# Patient Record
Sex: Female | Born: 1990 | Race: Black or African American | Hispanic: No | Marital: Single | Smoking: Never smoker
Health system: Southern US, Community
[De-identification: ages and names within clinical notes are randomized; demographics above are authoritative.]

## PROBLEM LIST (undated history)

## (undated) DIAGNOSIS — IMO0002 Reserved for concepts with insufficient information to code with codable children: Secondary | ICD-10-CM

## (undated) DIAGNOSIS — O210 Mild hyperemesis gravidarum: Secondary | ICD-10-CM

## (undated) HISTORY — DX: Reserved for concepts with insufficient information to code with codable children: IMO0002

## (undated) HISTORY — DX: Mild hyperemesis gravidarum: O21.0

## (undated) HISTORY — PX: OTHER SURGICAL HISTORY: SHX169

---

## 2011-02-22 ENCOUNTER — Encounter (HOSPITAL_COMMUNITY): Payer: Self-pay

## 2011-02-22 ENCOUNTER — Inpatient Hospital Stay (HOSPITAL_COMMUNITY)
Admission: AD | Admit: 2011-02-22 | Discharge: 2011-02-22 | Disposition: A | Discharge status: Home / Self Care | Payer: Medicaid Other | Source: Ambulatory Visit | Attending: Obstetrics and Gynecology | Admitting: Obstetrics and Gynecology

## 2011-02-22 DIAGNOSIS — O210 Mild hyperemesis gravidarum: Secondary | ICD-10-CM | POA: Insufficient documentation

## 2011-02-22 LAB — URINALYSIS, ROUTINE W REFLEX MICROSCOPIC
Ketones, ur: >80 mg/dL — AB
Leukocytes, UA: NEGATIVE
Nitrite: NEGATIVE
Specific Gravity, Urine: >1.03 — ABNORMAL HIGH (ref 1.005–1.030)
pH: 6 (ref 5.0–8.0)

## 2011-02-22 LAB — URINE MICROSCOPIC-ADD ON

## 2011-02-22 LAB — POCT PREGNANCY, URINE: Preg Test, Ur: POSITIVE

## 2011-02-22 MED ORDER — ONDANSETRON HCL 4 MG/2ML IJ SOLN
4.0000 mg | Freq: Once | INTRAMUSCULAR | Status: AC
  Administered 2011-02-22: 4 mg via INTRAVENOUS
  Filled 2011-02-22: qty 2

## 2011-02-22 MED ORDER — M.V.I. ADULT IV INJ
10.0000 mL | Freq: Once | INTRAVENOUS | Status: AC
  Administered 2011-02-22: 10 mL via INTRAVENOUS
  Filled 2011-02-22: qty 10

## 2011-02-22 MED ORDER — PROMETHAZINE HCL 25 MG/ML IJ SOLN
25.0000 mg | Freq: Once | INTRAVENOUS | Status: AC
  Administered 2011-02-22: 25 mg via INTRAVENOUS
  Filled 2011-02-22: qty 1

## 2011-02-22 MED ORDER — PROMETHAZINE HCL 12.5 MG PO TABS: 12.5000 mg | ORAL_TABLET | Freq: Four times a day (QID) | ORAL | Status: DC | PRN

## 2011-02-22 MED ORDER — ACETAMINOPHEN 160 MG/5ML PO SOLN
650.0000 mg | Freq: Once | ORAL | Status: AC
  Administered 2011-02-22: 649.6 mg via ORAL
  Filled 2011-02-22: qty 20.3

## 2011-02-22 NOTE — Progress Notes (Signed)
Patient states she has not been able to eat for about 3 days.

## 2011-02-22 NOTE — Plan of Care (Signed)
The patient was reports improvement of symptoms after receiving IV fluids and vitamins  A:  Hyperemesis of pregnancy  P:   Discharge to home  Prescription for Phenergan  Followup with primary care provider [facility]

## 2011-02-22 NOTE — ED Provider Notes (Addendum)
History   Chief Complaint:  Emesis During Pregnancy   Karen Rivera is  20 y.o. G1P0 Patient's last menstrual period was 01/12/2011.Marland Kitchen  Her pregnancy status is positive.  She is Unknown by LMP.  She presents complaining of Emesis During Pregnancy vomiting has been present for  2days.   OB History    Grav Para Term Preterm Abortions TAB SAB Ect Mult Living   1                Past Medical History  Diagnosis Date  . Asthma     No past surgical history on file.  Family History  Problem Relation Age of Onset  . Hypertension Mother     History  Substance Use Topics  . Smoking status: Not on file  . Smokeless tobacco: Not on file  . Alcohol Use: Not on file    Allergies: No Known Allergies  No prescriptions prior to admission    Review of Systems - History obtained from the patient  Gastrointestinal ROS: no abdominal pain, change in bowel habits, or black or bloody stools positive for - nausea/vomiting  Physical Exam   Blood pressure 133/83, pulse 105, temperature 99 F (37.2 C), temperature source Oral, resp. rate 16, height 5\' 7"  (1.702 m), weight 139 lb 3.2 oz (63.141 kg), last menstrual period 01/12/2011.  General: General appearance - oriented to person, place, and time and ill-appearing Mental status - alert, oriented to person, place, and time, normal mood, behavior, speech, dress, motor activity, and thought processes Musculoskeletal - full range of motion without pain Skin - normal coloration and turgor, no rashes, no suspicious skin lesions noted Focused Gynecological Exam: examination not indicated  Labs: Recent Results (from the past 24 hour(s))  URINALYSIS, ROUTINE W REFLEX MICROSCOPIC   Collection Time   02/22/11  4:50 PM      Component Value Range   Color, Urine YELLOW  YELLOW    Appearance HAZY (*) CLEAR    Specific Gravity, Urine >1.030 (*) 1.005 - 1.030    pH 6.0  5.0 - 8.0    Glucose, UA NEGATIVE  NEGATIVE (mg/dL)   Hgb urine dipstick  NEGATIVE  NEGATIVE    Bilirubin Urine NEGATIVE  NEGATIVE    Ketones, ur >80 (*) NEGATIVE (mg/dL)   Protein, ur 161 (*) NEGATIVE (mg/dL)   Urobilinogen, UA 0.2  0.0 - 1.0 (mg/dL)   Nitrite NEGATIVE  NEGATIVE    Leukocytes, UA NEGATIVE  NEGATIVE   URINE MICROSCOPIC-ADD ON   Collection Time   02/22/11  4:50 PM      Component Value Range   Squamous Epithelial / LPF FEW (*) RARE    Bacteria, UA FEW (*) RARE    Urine-Other MUCOUS PRESENT    POCT PREGNANCY, URINE   Collection Time   02/22/11  5:02 PM      Component Value Range   Preg Test, Ur POSITIVE      Assessment: Hyperemesis in pregnancy     Plan: Phenergan 25 mg in 1000 cc LR - 500 cc bolus and then 500 cc/hr.  Will give Zofran 4 mg IV push if needed for nausea. Advised to continue prenatal care  - [facility] Will give multivitamins in 1000cc D5LR with 500 cc bolus  And 500 cc per hour.  Discharge Medications:     Camiah Humm 02/22/2011, 5:53 PM     Nolene Bernheim, NP 03/08/11 [phone]

## 2011-02-22 NOTE — Progress Notes (Signed)
IV d/ced; pt getting ready to be discharged home;

## 2011-02-22 NOTE — Progress Notes (Signed)
Vomiting since yesterday had morning sickness

## 2011-02-28 ENCOUNTER — Encounter (HOSPITAL_COMMUNITY): Payer: Self-pay

## 2011-02-28 ENCOUNTER — Inpatient Hospital Stay (HOSPITAL_COMMUNITY)
Admission: AD | Admit: 2011-02-28 | Discharge: 2011-03-01 | Disposition: A | Discharge status: Home / Self Care | Payer: Medicaid Other | Source: Ambulatory Visit | Attending: Obstetrics & Gynecology | Admitting: Obstetrics & Gynecology

## 2011-02-28 DIAGNOSIS — O219 Vomiting of pregnancy, unspecified: Secondary | ICD-10-CM | POA: Insufficient documentation

## 2011-02-28 LAB — HIV ANTIBODY (ROUTINE TESTING W REFLEX): HIV: NONREACTIVE

## 2011-02-28 LAB — HEPATITIS B SURFACE ANTIGEN: Hepatitis B Surface Ag: NEGATIVE

## 2011-02-28 LAB — URINALYSIS, ROUTINE W REFLEX MICROSCOPIC
Bilirubin Urine: NEGATIVE
Hgb urine dipstick: NEGATIVE
Ketones, ur: >80 mg/dL — AB
Specific Gravity, Urine: >1.03 — ABNORMAL HIGH (ref 1.005–1.030)
Urobilinogen, UA: 0.2 mg/dL (ref 0.0–1.0)

## 2011-02-28 LAB — ABO/RH: RH Type: POSITIVE

## 2011-02-28 LAB — GC/CHLAMYDIA PROBE AMP, GENITAL: Chlamydia: NEGATIVE

## 2011-02-28 LAB — ANTIBODY SCREEN: Antibody Screen: NEGATIVE

## 2011-02-28 MED ORDER — M.V.I. ADULT IV INJ
10.0000 mL | Freq: Once | INTRAVENOUS | Status: AC
  Administered 2011-02-28: 10 mL via INTRAVENOUS
  Filled 2011-02-28: qty 10

## 2011-02-28 MED ORDER — PROMETHAZINE HCL 25 MG/ML IJ SOLN
25.0000 mg | Freq: Once | INTRAVENOUS | Status: AC
  Administered 2011-02-28: 25 mg via INTRAVENOUS
  Filled 2011-02-28: qty 1

## 2011-02-28 MED ORDER — SODIUM CHLORIDE 0.9 % IV SOLN
8.0000 mg | Freq: Once | INTRAVENOUS | Status: AC
  Administered 2011-02-28: 8 mg via INTRAVENOUS
  Filled 2011-02-28: qty 4

## 2011-02-28 NOTE — Progress Notes (Signed)
Pt states, I started vomiting for one and a half weeks ago and  I was here last Wed and had IVF. I was fine after that until yesterday and then I would vomit every time I ate anything. The pills are now working now. Even water touching my mouth. causes me to vomit

## 2011-02-28 NOTE — ED Provider Notes (Signed)
History     Chief Complaint  Patient presents with  . Dehydration   HPI 20 yo G1 presents with hyperemesis.  This is her second visit here for same sxs.  Patient of [facility].  Now [redacted]w[redacted]d pregnant.  Has phenergan at home but not working well now.  Denies vaginal bleeding or discharge.  Vomited X 7 today.  Can't keep food or fluid down.      Past Medical History  Diagnosis Date  . Asthma     Past Surgical History  Procedure Date  . Extraction of wisdom teeth     Family History  Problem Relation Age of Onset  . Hypertension Mother     History  Substance Use Topics  . Smoking status: Never Smoker   . Smokeless tobacco: Never Used  . Alcohol Use: No    Allergies: No Known Allergies  Prescriptions prior to admission  Medication Sig Dispense Refill  . ALBUTEROL IN Inhale 2 puffs into the lungs every 4 (four) hours as needed. Use as needed for athsma       . folic acid (FOLVITE) 400 MCG tablet Take 400 mcg by mouth daily.        . Menthol, Topical Analgesic, (ICY HOT EX) Apply 1 application topically daily as needed. As needed for back pain       . Multiple Vitamin (MULTIVITAMIN) tablet Take 1 tablet by mouth daily.        . promethazine (PHENERGAN) 12.5 MG tablet Take 1 tablet (12.5 mg total) by mouth every 6 (six) hours as needed for nausea.  30 tablet  0    Review of Systems  Gastrointestinal: Positive for nausea and vomiting. Negative for abdominal pain.  Genitourinary: Negative.  Flank pain: neg for discharge or vaginal bleeding.   Physical Exam   Blood pressure 133/83, pulse 86, temperature 99.2 F (37.3 C), temperature source Oral, resp. rate 16, height 5\' 6"  (1.676 m), weight 137 lb 9.6 oz (62.415 kg), last menstrual period 01/12/2011, SpO2 100.00%.  Physical Exam  GI: She exhibits no distension and no mass. There is no tenderness. There is no rebound and no guarding.    MAU Course  Procedures Results for orders placed during the hospital encounter of  02/28/11 (from the past 24 hour(s))  URINALYSIS, ROUTINE W REFLEX MICROSCOPIC     Status: Abnormal   Collection Time   02/28/11  6:00 PM      Component Value Range   Color, Urine YELLOW  YELLOW    Appearance CLEAR  CLEAR    Specific Gravity, Urine >1.030 (*) 1.005 - 1.030    pH 6.0  5.0 - 8.0    Glucose, UA NEGATIVE  NEGATIVE (mg/dL)   Hgb urine dipstick NEGATIVE  NEGATIVE    Bilirubin Urine NEGATIVE  NEGATIVE    Ketones, ur >80 (*) NEGATIVE (mg/dL)   Protein, ur NEGATIVE  NEGATIVE (mg/dL)   Urobilinogen, UA 0.2  0.0 - 1.0 (mg/dL)   Nitrite NEGATIVE  NEGATIVE    Leukocytes, UA NEGATIVE  NEGATIVE   POCT PREGNANCY, URINE     Status: Normal   Collection Time   02/28/11  6:05 PM      Component Value Range   Preg Test, Ur POSITIVE     MDM IV Hydration  Assessment and Plan  Hyperemesis IV Hydration  KEY,EVE M 02/28/2011, 7:20 PM   Matt Holmes, NP 03/11/11 1610

## 2011-02-28 NOTE — Progress Notes (Signed)
Pt states she has had some nausea and vomiting that had stopped. Started again 7-23 and when she has vomiting or gets cold has mid to upper abdominal pain. Has had a 14 lb weight loss since 6-15.

## 2011-03-01 MED ORDER — ONDANSETRON HCL 8 MG PO TABS: 8.0000 mg | ORAL_TABLET | Freq: Three times a day (TID) | ORAL | Status: AC | PRN

## 2011-03-01 MED ORDER — ONDANSETRON 8 MG PO TBDP: 8.0000 mg | ORAL_TABLET | Freq: Three times a day (TID) | ORAL | Status: AC | PRN

## 2011-03-01 NOTE — ED Provider Notes (Signed)
No vomiting. Nausea resolved w/ Zofran.   D/C home when second bag IV fluids complete.  Rx Zofran  Initiate [facility]  [place], [place]

## 2011-03-08 ENCOUNTER — Other Ambulatory Visit: Payer: Self-pay | Admitting: Family Medicine

## 2011-03-08 DIAGNOSIS — Z3682 Encounter for antenatal screening for nuchal translucency: Secondary | ICD-10-CM

## 2011-03-08 NOTE — ED Provider Notes (Signed)
Agree with above note.  Larayne Baxley 03/08/2011 2:17 PM   

## 2011-04-07 ENCOUNTER — Other Ambulatory Visit: Payer: Self-pay | Admitting: Obstetrics and Gynecology

## 2011-04-07 ENCOUNTER — Ambulatory Visit (HOSPITAL_COMMUNITY)
Admission: RE | Admit: 2011-04-07 | Discharge: 2011-04-07 | Disposition: A | Discharge status: Home / Self Care | Payer: Medicaid Other | Source: Ambulatory Visit | Attending: Family Medicine | Admitting: Family Medicine

## 2011-04-07 VITALS — BP 115/72 | HR 90 | Wt 142.0 lb

## 2011-04-07 DIAGNOSIS — Z3682 Encounter for antenatal screening for nuchal translucency: Secondary | ICD-10-CM

## 2011-04-07 DIAGNOSIS — O3510X0 Maternal care for (suspected) chromosomal abnormality in fetus, unspecified, not applicable or unspecified: Secondary | ICD-10-CM | POA: Insufficient documentation

## 2011-04-07 DIAGNOSIS — O351XX0 Maternal care for (suspected) chromosomal abnormality in fetus, not applicable or unspecified: Secondary | ICD-10-CM | POA: Insufficient documentation

## 2011-04-07 DIAGNOSIS — Z3689 Encounter for other specified antenatal screening: Secondary | ICD-10-CM | POA: Insufficient documentation

## 2011-04-07 NOTE — Progress Notes (Signed)
Ultrasound in AS/OBGYN/EPIC.  Follow up U/S scheduled 

## 2011-04-28 ENCOUNTER — Ambulatory Visit (HOSPITAL_COMMUNITY)
Admission: RE | Admit: 2011-04-28 | Discharge: 2011-04-28 | Disposition: A | Discharge status: Home / Self Care | Payer: Medicaid Other | Source: Ambulatory Visit | Attending: Family Medicine | Admitting: Family Medicine

## 2011-04-28 DIAGNOSIS — O351XX0 Maternal care for (suspected) chromosomal abnormality in fetus, not applicable or unspecified: Secondary | ICD-10-CM | POA: Insufficient documentation

## 2011-04-28 DIAGNOSIS — O3510X0 Maternal care for (suspected) chromosomal abnormality in fetus, unspecified, not applicable or unspecified: Secondary | ICD-10-CM | POA: Insufficient documentation

## 2011-05-12 ENCOUNTER — Other Ambulatory Visit (HOSPITAL_COMMUNITY): Payer: Self-pay | Admitting: Obstetrics and Gynecology

## 2011-05-12 ENCOUNTER — Ambulatory Visit (HOSPITAL_COMMUNITY)
Admission: RE | Admit: 2011-05-12 | Discharge: 2011-05-12 | Disposition: A | Discharge status: Home / Self Care | Payer: Medicaid Other | Source: Ambulatory Visit | Attending: Family Medicine | Admitting: Family Medicine

## 2011-05-12 DIAGNOSIS — Z3682 Encounter for antenatal screening for nuchal translucency: Secondary | ICD-10-CM

## 2011-05-12 DIAGNOSIS — Z3689 Encounter for other specified antenatal screening: Secondary | ICD-10-CM

## 2011-05-12 DIAGNOSIS — Z1389 Encounter for screening for other disorder: Secondary | ICD-10-CM | POA: Insufficient documentation

## 2011-05-12 DIAGNOSIS — O358XX0 Maternal care for other (suspected) fetal abnormality and damage, not applicable or unspecified: Secondary | ICD-10-CM | POA: Insufficient documentation

## 2011-05-12 DIAGNOSIS — O35EXX0 Maternal care for other (suspected) fetal abnormality and damage, fetal genitourinary anomalies, not applicable or unspecified: Secondary | ICD-10-CM

## 2011-05-12 DIAGNOSIS — Z363 Encounter for antenatal screening for malformations: Secondary | ICD-10-CM | POA: Insufficient documentation

## 2011-05-12 NOTE — Progress Notes (Signed)
Encounter addended by: Macarthur Critchley. Rachel Bo on: 05/12/2011 10:42 AM<BR>     Documentation filed: Orders

## 2011-06-20 ENCOUNTER — Ambulatory Visit (HOSPITAL_COMMUNITY)
Admission: RE | Admit: 2011-06-20 | Discharge: 2011-06-20 | Disposition: A | Discharge status: Home / Self Care | Payer: Medicaid Other | Source: Ambulatory Visit | Attending: Maternal and Fetal Medicine | Admitting: Maternal and Fetal Medicine

## 2011-06-20 DIAGNOSIS — Z3689 Encounter for other specified antenatal screening: Secondary | ICD-10-CM | POA: Insufficient documentation

## 2011-06-20 DIAGNOSIS — O358XX0 Maternal care for other (suspected) fetal abnormality and damage, not applicable or unspecified: Secondary | ICD-10-CM

## 2011-06-20 NOTE — Progress Notes (Signed)
Obstetric ultrasound performed today.  Please see report in ASOBGYN. 

## 2011-06-22 NOTE — Progress Notes (Signed)
Encounter addended by: Marlana Latus, RN on: 06/22/2011  5:00 PM<BR>     Documentation filed: Episodes, Chief Complaint Section

## 2011-08-08 NOTE — L&D Delivery Note (Addendum)
Delivery Note At 8:48 PM a viable female was delivered via Vaginal, Spontaneous Delivery (Presentation: Left Occiput Anterior, left hand by face).  APGAR: 9, 9; weight 5 lb 9.1 oz (2526 g).   Placenta status: Intact, Spontaneous.  Cord: 3 vessels with the following complications: None.  Cord pH: Not indicated  Anesthesia: None  Episiotomy: None Lacerations: None Suture Repair: N/A Est. Blood Loss (mL): 600  Mom to postpartum.  Baby to nursery-stable.  HAIRFORD, AMBER 09/29/2011, 9:06 PM   I attended and precepted the delivery with Dr Mikel Cella.  I agree with the above note.  Candelaria Celeste JEHIEL 09/29/2011 9:11 PM

## 2011-08-10 ENCOUNTER — Ambulatory Visit (HOSPITAL_COMMUNITY)
Admission: RE | Admit: 2011-08-10 | Discharge: 2011-08-10 | Disposition: A | Discharge status: Home / Self Care | Payer: Medicaid Other | Source: Ambulatory Visit | Attending: Family Medicine | Admitting: Family Medicine

## 2011-08-10 DIAGNOSIS — O359XX0 Maternal care for (suspected) fetal abnormality and damage, unspecified, not applicable or unspecified: Secondary | ICD-10-CM

## 2011-08-10 DIAGNOSIS — O358XX0 Maternal care for other (suspected) fetal abnormality and damage, not applicable or unspecified: Secondary | ICD-10-CM

## 2011-08-10 DIAGNOSIS — Z3689 Encounter for other specified antenatal screening: Secondary | ICD-10-CM | POA: Insufficient documentation

## 2011-08-10 NOTE — Progress Notes (Signed)
Obstetric ultrasound performed today.  Please see report in ASOBGYN. 

## 2011-09-01 ENCOUNTER — Ambulatory Visit (HOSPITAL_COMMUNITY)
Admission: RE | Admit: 2011-09-01 | Discharge: 2011-09-01 | Disposition: A | Discharge status: Home / Self Care | Payer: Medicaid Other | Source: Ambulatory Visit | Attending: Family Medicine | Admitting: Family Medicine

## 2011-09-01 DIAGNOSIS — O365990 Maternal care for other known or suspected poor fetal growth, unspecified trimester, not applicable or unspecified: Secondary | ICD-10-CM | POA: Insufficient documentation

## 2011-09-01 DIAGNOSIS — Z3689 Encounter for other specified antenatal screening: Secondary | ICD-10-CM | POA: Insufficient documentation

## 2011-09-01 DIAGNOSIS — O359XX0 Maternal care for (suspected) fetal abnormality and damage, unspecified, not applicable or unspecified: Secondary | ICD-10-CM

## 2011-09-14 ENCOUNTER — Ambulatory Visit (HOSPITAL_COMMUNITY): Payer: Medicaid Other

## 2011-09-21 ENCOUNTER — Ambulatory Visit (HOSPITAL_COMMUNITY)
Admission: RE | Admit: 2011-09-21 | Discharge: 2011-09-21 | Disposition: A | Discharge status: Home / Self Care | Payer: Medicaid Other | Source: Ambulatory Visit | Attending: Family Medicine | Admitting: Family Medicine

## 2011-09-21 ENCOUNTER — Other Ambulatory Visit (HOSPITAL_COMMUNITY): Payer: Self-pay | Admitting: Maternal and Fetal Medicine

## 2011-09-21 DIAGNOSIS — O359XX0 Maternal care for (suspected) fetal abnormality and damage, unspecified, not applicable or unspecified: Secondary | ICD-10-CM

## 2011-09-21 DIAGNOSIS — O365990 Maternal care for other known or suspected poor fetal growth, unspecified trimester, not applicable or unspecified: Secondary | ICD-10-CM | POA: Insufficient documentation

## 2011-09-28 ENCOUNTER — Ambulatory Visit (HOSPITAL_COMMUNITY)
Admission: RE | Admit: 2011-09-28 | Discharge: 2011-09-28 | Disposition: A | Discharge status: Home / Self Care | Payer: Medicaid Other | Source: Ambulatory Visit | Attending: Family Medicine | Admitting: Family Medicine

## 2011-09-28 ENCOUNTER — Inpatient Hospital Stay (HOSPITAL_COMMUNITY)
Admission: RE | Admit: 2011-09-28 | Discharge: 2011-10-01 | DRG: 775 | Disposition: A | Discharge status: Home / Self Care | Payer: Medicaid Other | Source: Ambulatory Visit | Attending: Obstetrics & Gynecology | Admitting: Obstetrics & Gynecology

## 2011-09-28 ENCOUNTER — Encounter (HOSPITAL_COMMUNITY): Payer: Self-pay

## 2011-09-28 ENCOUNTER — Other Ambulatory Visit (HOSPITAL_COMMUNITY): Payer: Self-pay | Admitting: Maternal and Fetal Medicine

## 2011-09-28 ENCOUNTER — Encounter (HOSPITAL_COMMUNITY): Payer: Self-pay | Admitting: *Deleted

## 2011-09-28 ENCOUNTER — Telehealth (HOSPITAL_COMMUNITY): Payer: Self-pay | Admitting: *Deleted

## 2011-09-28 DIAGNOSIS — O359XX0 Maternal care for (suspected) fetal abnormality and damage, unspecified, not applicable or unspecified: Secondary | ICD-10-CM

## 2011-09-28 DIAGNOSIS — O4100X0 Oligohydramnios, unspecified trimester, not applicable or unspecified: Principal | ICD-10-CM | POA: Diagnosis present

## 2011-09-28 DIAGNOSIS — O365990 Maternal care for other known or suspected poor fetal growth, unspecified trimester, not applicable or unspecified: Secondary | ICD-10-CM | POA: Diagnosis present

## 2011-09-28 MED ORDER — BUTORPHANOL TARTRATE 2 MG/ML IJ SOLN
1.0000 mg | INTRAMUSCULAR | Status: DC | PRN
  Administered 2011-09-29 (×2): 1 mg via INTRAVENOUS
  Filled 2011-09-28 (×2): qty 1

## 2011-09-28 MED ORDER — ACETAMINOPHEN 325 MG PO TABS: 650.0000 mg | ORAL_TABLET | ORAL | Status: DC | PRN

## 2011-09-28 MED ORDER — ONDANSETRON HCL 4 MG/2ML IJ SOLN: 4.0000 mg | Freq: Four times a day (QID) | INTRAMUSCULAR | Status: DC | PRN

## 2011-09-28 MED ORDER — PRENATAL PLUS 27-1 MG PO TABS: 1.0000 | ORAL_TABLET | Freq: Every day | ORAL | Status: DC

## 2011-09-28 MED ORDER — OXYTOCIN 20 UNITS IN LACTATED RINGERS INFUSION - SIMPLE
125.0000 mL/h | Freq: Once | INTRAVENOUS | Status: AC
  Administered 2011-09-29: 125 mL/h via INTRAVENOUS

## 2011-09-28 MED ORDER — OXYCODONE-ACETAMINOPHEN 5-325 MG PO TABS: 1.0000 | ORAL_TABLET | ORAL | Status: DC | PRN

## 2011-09-28 MED ORDER — IBUPROFEN 600 MG PO TABS
600.0000 mg | ORAL_TABLET | Freq: Four times a day (QID) | ORAL | Status: DC | PRN
  Administered 2011-09-29: 600 mg via ORAL
  Filled 2011-09-28: qty 1

## 2011-09-28 MED ORDER — CITRIC ACID-SODIUM CITRATE 334-500 MG/5ML PO SOLN: 30.0000 mL | ORAL | Status: DC | PRN

## 2011-09-28 MED ORDER — LACTATED RINGERS IV SOLN
INTRAVENOUS | Status: DC
  Administered 2011-09-29 (×3): via INTRAVENOUS

## 2011-09-28 MED ORDER — LIDOCAINE HCL (PF) 1 % IJ SOLN
30.0000 mL | INTRAMUSCULAR | Status: DC | PRN
  Filled 2011-09-28: qty 30

## 2011-09-28 MED ORDER — MISOPROSTOL 25 MCG QUARTER TABLET
25.0000 ug | ORAL_TABLET | ORAL | Status: DC | PRN
  Administered 2011-09-28 – 2011-09-29 (×3): 25 ug via VAGINAL
  Filled 2011-09-28 (×3): qty 0.25

## 2011-09-28 MED ORDER — FLEET ENEMA 7-19 GM/118ML RE ENEM: 1.0000 | ENEMA | RECTAL | Status: DC | PRN

## 2011-09-28 MED ORDER — LACTATED RINGERS IV SOLN: 500.0000 mL | INTRAVENOUS | Status: DC | PRN

## 2011-09-28 MED ORDER — OXYTOCIN BOLUS FROM INFUSION
500.0000 mL | Freq: Once | INTRAVENOUS | Status: DC
  Filled 2011-09-28: qty 500

## 2011-09-28 MED ORDER — ZOLPIDEM TARTRATE 10 MG PO TABS
10.0000 mg | ORAL_TABLET | Freq: Every evening | ORAL | Status: DC | PRN
  Administered 2011-09-29: 10 mg via ORAL
  Filled 2011-09-28: qty 1

## 2011-09-28 MED ORDER — TERBUTALINE SULFATE 1 MG/ML IJ SOLN: 0.2500 mg | Freq: Once | INTRAMUSCULAR | Status: AC | PRN

## 2011-09-28 NOTE — H&P (Signed)
Attestation of Attending Supervision of Resident: Evaluation and management procedures were performed by the [facility] Resident under my supervision.  I have reviewed the resident's note and chart, and I agree with management and plan.  Jaynie Collins, M.D. 09/28/2011 11:14 PM
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
## 2011-09-28 NOTE — Progress Notes (Signed)
OB ultrasound performed today.  Please see full report in ASOBGYN.  IUP at 37 weeks 0 days Oligohydramnios Reassuring BPP Normal umbilical artery Doppler measurements  Given gestational age with a finding of oligohydramnios and poor fetal growth, delivery is recommended.  Patient requested to be scheduled later this evening for admission.  Patient discussed with Dr. Debroah Loop.

## 2011-09-28 NOTE — Telephone Encounter (Signed)
Preadmission screen  

## 2011-09-29 ENCOUNTER — Encounter (HOSPITAL_COMMUNITY): Payer: Self-pay

## 2011-09-29 DIAGNOSIS — O4100X0 Oligohydramnios, unspecified trimester, not applicable or unspecified: Secondary | ICD-10-CM

## 2011-09-29 DIAGNOSIS — O365990 Maternal care for other known or suspected poor fetal growth, unspecified trimester, not applicable or unspecified: Secondary | ICD-10-CM

## 2011-09-29 LAB — RPR: RPR Ser Ql: NONREACTIVE

## 2011-09-29 MED ORDER — TERBUTALINE SULFATE 1 MG/ML IJ SOLN: 0.2500 mg | Freq: Once | INTRAMUSCULAR | Status: DC | PRN

## 2011-09-29 MED ORDER — OXYCODONE-ACETAMINOPHEN 5-325 MG PO TABS
1.0000 | ORAL_TABLET | ORAL | Status: DC | PRN
  Administered 2011-09-30 (×3): 1 via ORAL
  Filled 2011-09-29 (×3): qty 1

## 2011-09-29 MED ORDER — IBUPROFEN 600 MG PO TABS
600.0000 mg | ORAL_TABLET | Freq: Four times a day (QID) | ORAL | Status: DC
  Administered 2011-09-30 – 2011-10-01 (×6): 600 mg via ORAL
  Filled 2011-09-29 (×7): qty 1

## 2011-09-29 MED ORDER — TETANUS-DIPHTH-ACELL PERTUSSIS 5-2.5-18.5 LF-MCG/0.5 IM SUSP: 0.5000 mL | Freq: Once | INTRAMUSCULAR | Status: DC

## 2011-09-29 MED ORDER — DIPHENHYDRAMINE HCL 25 MG PO CAPS: 25.0000 mg | ORAL_CAPSULE | Freq: Four times a day (QID) | ORAL | Status: DC | PRN

## 2011-09-29 MED ORDER — PRENATAL MULTIVITAMIN CH
1.0000 | ORAL_TABLET | Freq: Every day | ORAL | Status: DC
  Administered 2011-09-30 – 2011-10-01 (×2): 1 via ORAL
  Filled 2011-09-29 (×2): qty 1

## 2011-09-29 MED ORDER — LANOLIN HYDROUS EX OINT: TOPICAL_OINTMENT | CUTANEOUS | Status: DC | PRN

## 2011-09-29 MED ORDER — BENZOCAINE-MENTHOL 20-0.5 % EX AERO
INHALATION_SPRAY | CUTANEOUS | Status: AC
  Administered 2011-09-30: 1 via TOPICAL
  Filled 2011-09-29: qty 56

## 2011-09-29 MED ORDER — WITCH HAZEL-GLYCERIN EX PADS: 1.0000 "application " | MEDICATED_PAD | CUTANEOUS | Status: DC | PRN

## 2011-09-29 MED ORDER — DIBUCAINE 1 % RE OINT: 1.0000 "application " | TOPICAL_OINTMENT | RECTAL | Status: DC | PRN

## 2011-09-29 MED ORDER — ONDANSETRON HCL 4 MG/2ML IJ SOLN: 4.0000 mg | INTRAMUSCULAR | Status: DC | PRN

## 2011-09-29 MED ORDER — ONDANSETRON HCL 4 MG PO TABS: 4.0000 mg | ORAL_TABLET | ORAL | Status: DC | PRN

## 2011-09-29 MED ORDER — SIMETHICONE 80 MG PO CHEW: 80.0000 mg | CHEWABLE_TABLET | ORAL | Status: DC | PRN

## 2011-09-29 MED ORDER — OXYTOCIN 20 UNITS IN LACTATED RINGERS INFUSION - SIMPLE
1.0000 m[IU]/min | INTRAVENOUS | Status: DC
Start: 2011-09-29 — End: 2011-09-29
  Administered 2011-09-29: 2 m[IU]/min via INTRAVENOUS
  Filled 2011-09-29: qty 1000

## 2011-09-29 MED ORDER — SENNOSIDES-DOCUSATE SODIUM 8.6-50 MG PO TABS
2.0000 | ORAL_TABLET | Freq: Every day | ORAL | Status: DC
  Administered 2011-09-30: 2 via ORAL

## 2011-09-29 MED ORDER — ZOLPIDEM TARTRATE 5 MG PO TABS: 5.0000 mg | ORAL_TABLET | Freq: Every evening | ORAL | Status: DC | PRN

## 2011-09-29 MED ORDER — BENZOCAINE-MENTHOL 20-0.5 % EX AERO
1.0000 "application " | INHALATION_SPRAY | CUTANEOUS | Status: DC | PRN
  Administered 2011-09-30: 1 via TOPICAL

## 2011-09-29 NOTE — Progress Notes (Signed)
Subjective: Very uncomfortable with contractions.  Objective: BP 119/77  Pulse 92  Temp(Src) 98.5 F (36.9 C) (Oral)  Resp 18  Ht 5\' 7"  (1.702 m)  Wt 76.658 kg (169 lb)  BMI 26.47 kg/m2  LMP 01/12/2011      FHT:  FHR: 130s bpm, variability: moderate,  accelerations:  Present,  decelerations:  Absent UC:   regular, every 2 minutes SVE:   Dilation: 6 Effacement (%): 100 Station: -1 Exam by:: lee  Labs: No results found for this basename: WBC, HGB, HCT, MCV, PLT    Assessment / Plan: Continue pitocin.  Category 1 tracing.  Expect NSVD  Karen Rivera JEHIEL 09/29/2011, 6:48 PM

## 2011-09-29 NOTE — Progress Notes (Signed)
Subjective: Patient feeling cramping.  Objective: BP 113/73  Pulse 84  Temp(Src) 98.4 F (36.9 C) (Oral)  Resp 16  Ht 5\' 7"  (1.702 m)  Wt 76.658 kg (169 lb)  BMI 26.47 kg/m2  LMP 01/12/2011      FHT:  FHR: 130s bpm, variability: moderate,  accelerations:  Present,  decelerations:  Absent UC:   regular, every 2-4 minutes SVE:   Dilation: 1 Effacement (%): 50 Station: -2 Exam by:: Tressia Danas RN  Labs: No results found for this basename: WBC, HGB, HCT, MCV, PLT    Assessment / Plan: Foley balloon placed.  Will start pitocin.  Karen Rivera 09/29/2011, 1:04 PM

## 2011-09-29 NOTE — Progress Notes (Signed)
Karen Rivera is a 21 y.o. G1P0000 at [redacted]w[redacted]d  Subjective: Patient doing well. Cytotec placed at 2345 with no difficulty. Patient had dinner, will now be on clears diet. No concerns at this time  Objective: BP 117/75  Pulse 91  Temp(Src) 98.4 F (36.9 C) (Oral)  Resp 18  Ht 5\' 7"  (1.702 m)  Wt 76.658 kg (169 lb)  BMI 26.47 kg/m2  LMP 01/12/2011     FHT:  FHR: 140's bpm, variability: moderate,  accelerations:  Absent,  decelerations:  Absent UC:   none SVE:   Dilation: 1 Station: -2 Exam by:: T. Lessard RN  Labs: No results found for this basename: WBC, HGB, HCT, MCV, PLT    Assessment / Plan: Induction of labor due to oligo  Labor: Progressing normally Fetal Wellbeing:  Category I Pain Control:  N/A Anticipated MOD:  NSVD  Discussed with Sharen Counter CNM  HAIRFORD, AMBER 09/29/2011, 1:04 AM

## 2011-09-29 NOTE — Progress Notes (Signed)
Patient ID: Karen Rivera, female   DOB: Dec 08, 1990, 21 y.o.   MRN: [id number]  Foley balloon out.    Dilation: 5 Effacement (%): 80 Cervical Position: Middle Station: -2 Presentation: Vertex Exam by:: Brenda Samano  Continue with pitocin.  Ctx q 2 minutes.  Adequate progress.  Candelaria Celeste JEHIEL 09/29/2011 3:38 PM

## 2011-09-29 NOTE — Progress Notes (Signed)
Karen Rivera is a 21 y.o. G1P0000 at [redacted]w[redacted]d  Subjective: Resting comfortably. Received second dose of Cytotec at 0350. Did receive Ambien to help her rest as well. No concerns at this time.  Objective: BP 121/81  Pulse 101  Temp(Src) 98.5 F (36.9 C) (Oral)  Resp 18  Ht 5\' 7"  (1.702 m)  Wt 76.658 kg (169 lb)  BMI 26.47 kg/m2  LMP 01/12/2011    FHT:  FHR: 140's bpm, variability: minimal ,  accelerations:  Absent,  decelerations:  Absent UC:   none SVE:   Dilation: 1 Effacement (%): 40 Station: -2 Exam by:: T.Lessard RN  Labs: No results found for this basename: WBC, HGB, HCT, MCV, PLT    Assessment / Plan: Induction of labor due to Oligo. Received Cytotec x2  Labor: Progressing normally Fetal Wellbeing:  Category I Pain Control:  N/A Anticipated MOD:  NSVD  Ryane Konieczny 09/29/2011, 4:12 AM

## 2011-09-30 NOTE — Progress Notes (Signed)
Post Partum Day #1 Subjective: no complaints, up ad lib, voiding, tolerating PO and + flatus Patient states she has been OOB to bathroom. She feels "lazy" but does not have any complaints. States she is still bleeding but it has slowed down.   Objective: Blood pressure 96/58, pulse 87, temperature 98.3 F (36.8 C), temperature source Oral, resp. rate 18, height 5\' 7"  (1.702 m), weight 76.658 kg (169 lb), last menstrual period 01/12/2011, SpO2 99.00%, unknown if currently breastfeeding.  Physical Exam:  General: cooperative, no distress and sleeping but awakes easily and answers questions Lochia: appropriate Uterine Fundus: firm Incision: N/A DVT Evaluation: No evidence of DVT seen on physical exam.  No results found for this basename: HGB:2,HCT:2 in the last 72 hours  Assessment/Plan: Plan for discharge tomorrow, Breastfeeding and Lactation consult Mom plans to use Minipill for contraception. [facility]   LOS: 2 days   Karen Rivera 09/30/2011, 6:59 AM

## 2011-10-01 MED ORDER — ACETAMINOPHEN-CODEINE 300-30 MG PO TABS
1.0000 | ORAL_TABLET | ORAL | Status: DC | PRN
Start: 2011-10-01 — End: 2011-11-27

## 2011-10-01 MED ORDER — NORETHINDRONE 0.35 MG PO TABS: 1.0000 | ORAL_TABLET | Freq: Every day | ORAL | Status: DC

## 2011-10-01 MED ORDER — IBUPROFEN 600 MG PO TABS: 600.0000 mg | ORAL_TABLET | Freq: Four times a day (QID) | ORAL | Status: AC

## 2011-10-01 NOTE — Progress Notes (Signed)
Post Partum Day 2 Subjective: no complaints, up ad lib, voiding, tolerating PO and breastfeeding well.  Objective: Blood pressure 101/68, pulse 66, temperature 98 F (36.7 C), temperature source Oral, resp. rate 20, height 5\' 7"  (1.702 m), weight 76.658 kg (169 lb), last menstrual period 01/12/2011, SpO2 99.00%, unknown if currently breastfeeding.  Physical Exam:  General: alert, cooperative and appears stated age Lochia: appropriate Uterine Fundus: firm Incision: n/a DVT Evaluation: No evidence of DVT seen on physical exam. Negative Homan's sign.  No results found for this basename: HGB:2,HCT:2 in the last 72 hours  Assessment/Plan: Discharge home, Breastfeeding and Contraception Micronor.   LOS: 3 days   [facility] 10/01/2011, 8:53 AM

## 2011-10-01 NOTE — Progress Notes (Signed)
PSYCHOSOCIAL ASSESSMENT ~ MATERNAL/CHILD Name:  Karen Rivera        Age: 20 days    Referral Date: 09/29/2011   Reason/Source: Hx of MJ use during pregnancy/CN I. FAMILY/HOME ENVIRONMENT A. Child's Legal Guardian Parent(s)    Name:  Karen Rivera DOB: 08-26-1990    Age:  61 Address:  [address] [place], [place] [address] Name:  Karen Rivera Address: same B. Other Household Members/Support Persons:   Maternal and paternal grandmothers  C.   Other Support: extended family and friends II. PSYCHOSOCIAL DATA A. Information Source X Patient Interview X Family Interview           B. Financial and [employer] X Medicaid- [employer]     X Food Stamps- MOB will apply      X WIC  X School:  MOB-[employer], FOB-[employer]  C. Cultural and Environment Information/Cultural Issues Impacting Care: N/A III. STRENGTHS X Supportive family/friends   X Adequate Resources  X Compliance with medical plan  X Home prepared for Child (including basic supplies)                 X Other- [facility]  IV. RISK FACTORS AND CURRENT PROBLEMS        X No Problems Noted                             V. SOCIAL WORK ASSESSMENT  Met MOB, FOB, and baby at bedside.  MOB reports that she was being honest when reporting her "smoking" history and states she was referring to cigarettes.  Discussed importance of keeping exposures/substances away from baby to ensure optimal health and well being.  Parents expressed understanding.  They live independently and have no concerns about their ability to meet the needs of their newborn.  Both parents are very happy and excited about baby's arrival.  Parents receive help and support from their families as needed.  Parents report that they intend to adjust their school class schedules so that either MOB or FOB can care for baby.  MOB also reports she has a trusted friend who can babysit if needed.  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  FOB is a Investment banker, corporate major with a goal of becoming a Clinical research associate.  Parents feel confident that their role as new parents will not impact their ability to maintain their schooling.  MOB is knowledgeable about accessing public resources and plans to set up Food Stamps for her family along with adding baby to [facility] and Medicaid.  FOB is looking for work and is contemplating returning to school for the summer or fall semester.  MOB intends to return to school after spring break.  Commended parents for continuing their educational pursuits and utilizing supports that are available to them.  Discussed importance of maintaining consistent well child visits and healthy habits.  MOB feels well physically and emotionally.  She does not anticipate any postpartum challenges.  Both parents were happy, engaging, communicative and participatory.  No concerns or needs were expressed.    VI. SOCIAL WORK PLAN X No Further Intervention Required/No Barriers to Discharge X Patient/Family Education: Feelings after Birth  Staci Acosta, MSW, LCSW, 10/01/2011, 10:18 am

## 2011-10-01 NOTE — Discharge Summary (Signed)
Obstetric Discharge Summary Reason for Admission: induction of labor and oligohydramnios. Prenatal Procedures: NST and ultrasound Intrapartum Procedures: spontaneous vaginal delivery Postpartum Procedures: none Complications-Operative and Postpartum: none No results found for this basename: hgb, hct    Discharge Diagnoses: Term Pregnancy-delivered  Discharge Information: Date: 10/01/2011 Activity: pelvic rest Diet: routine Medications: Tylenol #3 and Ibuprofen Condition: stable Instructions: refer to practice specific booklet Discharge to: home Follow-up Information    Follow up with [facility] in 6 weeks.         Newborn Data: Live born female  Birth Weight: 5 lb 9.1 oz (2526 g) APGAR: 9, 9  Home with mother.  [facility] 10/01/2011, 8:57 AM

## 2011-10-02 NOTE — Discharge Summary (Signed)
Agree with above note.  Karen Rivera 10/02/2011 11:54 AM

## 2011-10-02 NOTE — Progress Notes (Signed)
Post discharge chart review completed.  

## 2011-10-26 ENCOUNTER — Encounter: Payer: Self-pay | Admitting: Obstetrics and Gynecology

## 2011-10-31 ENCOUNTER — Emergency Department (INDEPENDENT_AMBULATORY_CARE_PROVIDER_SITE_OTHER)
Admission: EM | Admit: 2011-10-31 | Discharge: 2011-10-31 | Disposition: A | Discharge status: Home / Self Care | Payer: Self-pay | Source: Home / Self Care | Attending: Family Medicine | Admitting: Family Medicine

## 2011-10-31 ENCOUNTER — Encounter (HOSPITAL_COMMUNITY): Payer: Self-pay | Admitting: Emergency Medicine

## 2011-10-31 DIAGNOSIS — N6459 Other signs and symptoms in breast: Secondary | ICD-10-CM

## 2011-10-31 MED ORDER — NYSTATIN-TRIAMCINOLONE 100000-0.1 UNIT/GM-% EX CREA: TOPICAL_CREAM | CUTANEOUS | Status: DC

## 2011-10-31 MED ORDER — IBUPROFEN 600 MG PO TABS: 600.0000 mg | ORAL_TABLET | Freq: Three times a day (TID) | ORAL | Status: AC | PRN

## 2011-10-31 NOTE — Discharge Instructions (Signed)
Take/use the prescribed medications as instructed. If you still decide to completely stop breast-feeding you might want to start a regular birth control pill other than Micronor. Can use hot shower and manually express the milk onto the breasts feel more comfortable. And can use cold compress after that. Return if redness, thickening of the skin, persistent painful area in one breast despite milk expression, fever, chills or malaise.

## 2011-10-31 NOTE — ED Notes (Signed)
Pt. Stated, I've had breast pain and itching since I had my baby, I was breast feeding for a month and just stopped Sat. And now they hurt.
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
## 2011-11-27 ENCOUNTER — Encounter (HOSPITAL_COMMUNITY): Payer: Self-pay | Admitting: *Deleted

## 2011-11-27 ENCOUNTER — Inpatient Hospital Stay (HOSPITAL_COMMUNITY)
Admission: AD | Admit: 2011-11-27 | Discharge: 2011-11-27 | Disposition: A | Discharge status: Home / Self Care | Payer: Medicaid Other | Source: Ambulatory Visit | Attending: Obstetrics and Gynecology | Admitting: Obstetrics and Gynecology

## 2011-11-27 DIAGNOSIS — R197 Diarrhea, unspecified: Secondary | ICD-10-CM | POA: Insufficient documentation

## 2011-11-27 DIAGNOSIS — R112 Nausea with vomiting, unspecified: Secondary | ICD-10-CM

## 2011-11-27 LAB — URINE MICROSCOPIC-ADD ON

## 2011-11-27 LAB — URINALYSIS, ROUTINE W REFLEX MICROSCOPIC
Glucose, UA: NEGATIVE mg/dL
Hgb urine dipstick: NEGATIVE
Ketones, ur: NEGATIVE mg/dL
Leukocytes, UA: NEGATIVE
pH: 8.5 — ABNORMAL HIGH (ref 5.0–8.0)

## 2011-11-27 LAB — POCT PREGNANCY, URINE: Preg Test, Ur: NEGATIVE

## 2011-11-27 LAB — DIFFERENTIAL
Basophils Absolute: 0 10*3/uL (ref 0.0–0.1)
Basophils Relative: 0 % (ref 0–1)
Eosinophils Absolute: 0.1 10*3/uL (ref 0.0–0.7)
Eosinophils Relative: 1 % (ref 0–5)

## 2011-11-27 LAB — CBC
MCH: 30.3 pg (ref 26.0–34.0)
MCV: 89.5 fL (ref 78.0–100.0)
Platelets: 390 10*3/uL (ref 150–400)
RDW: 12.5 % (ref 11.5–15.5)
WBC: 10.7 10*3/uL — ABNORMAL HIGH (ref 4.0–10.5)

## 2011-11-27 MED ORDER — PROMETHAZINE HCL 12.5 MG PO TABS: 12.5000 mg | ORAL_TABLET | Freq: Four times a day (QID) | ORAL | Status: DC | PRN

## 2011-11-27 MED ORDER — ONDANSETRON 8 MG PO TBDP
8.0000 mg | ORAL_TABLET | Freq: Once | ORAL | Status: AC
  Administered 2011-11-27: 8 mg via ORAL
  Filled 2011-11-27: qty 1
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
## 2011-11-27 NOTE — Discharge Instructions (Signed)
B.R.A.T. Diet Your doctor has recommended the B.R.A.T. diet for you or your child until the condition improves. This is often used to help control diarrhea and vomiting symptoms. If you or your child can tolerate clear liquids, you may have:  Bananas.   Rice.   Applesauce.   Toast (and other simple starches such as crackers, potatoes, noodles).  Be sure to avoid dairy products, meats, and fatty foods until symptoms are better. Fruit juices such as apple, grape, and prune juice can make diarrhea worse. Avoid these. Continue this diet for 2 days or as instructed by your caregiver. Document Released: 07/24/2005 Document Revised: 07/13/2011 Document Reviewed: 01/10/2007 [employer] Patient Information 2012 [employer].Nausea and Vomiting Nausea is a sick feeling that often comes before throwing up (vomiting). Vomiting is a reflex where stomach contents come out of your mouth. Vomiting can cause severe loss of body fluids (dehydration). Children and elderly adults can become dehydrated quickly, especially if they also have diarrhea. Nausea and vomiting are symptoms of a condition or disease. It is important to find the cause of your symptoms. CAUSES   Direct irritation of the stomach lining. This irritation can result from increased acid production (gastroesophageal reflux disease), infection, food poisoning, taking certain medicines (such as nonsteroidal anti-inflammatory drugs), alcohol use, or tobacco use.   Signals from the brain.These signals could be caused by a headache, heat exposure, an inner ear disturbance, increased pressure in the brain from injury, infection, a tumor, or a concussion, pain, emotional stimulus, or metabolic problems.   An obstruction in the gastrointestinal tract (bowel obstruction).   Illnesses such as diabetes, hepatitis, gallbladder problems, appendicitis, kidney problems, cancer, sepsis, atypical symptoms of a heart attack, or eating disorders.   Medical  treatments such as chemotherapy and radiation.   Receiving medicine that makes you sleep (general anesthetic) during surgery.  DIAGNOSIS Your caregiver may ask for tests to be done if the problems do not improve after a few days. Tests may also be done if symptoms are severe or if the reason for the nausea and vomiting is not clear. Tests may include:  Urine tests.   Blood tests.   Stool tests.   Cultures (to look for evidence of infection).   X-rays or other imaging studies.  Test results can help your caregiver make decisions about treatment or the need for additional tests. TREATMENT You need to stay well hydrated. Drink frequently but in small amounts.You may wish to drink water, sports drinks, clear broth, or eat frozen ice pops or gelatin dessert to help stay hydrated.When you eat, eating slowly may help prevent nausea.There are also some antinausea medicines that may help prevent nausea. HOME CARE INSTRUCTIONS   Take all medicine as directed by your caregiver.   If you do not have an appetite, do not force yourself to eat. However, you must continue to drink fluids.   If you have an appetite, eat a normal diet unless your caregiver tells you differently.   Eat a variety of complex carbohydrates (rice, wheat, potatoes, bread), lean meats, yogurt, fruits, and vegetables.   Avoid high-fat foods because they are more difficult to digest.   Drink enough water and fluids to keep your urine clear or pale yellow.   If you are dehydrated, ask your caregiver for specific rehydration instructions. Signs of dehydration may include:   Severe thirst.   Dry lips and mouth.   Dizziness.   Dark urine.   Decreasing urine frequency and amount.   Confusion.     Rapid breathing or pulse.  SEEK IMMEDIATE MEDICAL CARE IF:   You have blood or brown flecks (like coffee grounds) in your vomit.   You have black or bloody stools.   You have a severe headache or stiff neck.   You  are confused.   You have severe abdominal pain.   You have chest pain or trouble breathing.   You do not urinate at least once every 8 hours.   You develop cold or clammy skin.   You continue to vomit for longer than 24 to 48 hours.   You have a fever.  MAKE SURE YOU:   Understand these instructions.   Will watch your condition.   Will get help right away if you are not doing well or get worse.  Document Released: 07/24/2005 Document Revised: 07/13/2011 Document Reviewed: 12/21/2010 [facility] Patient Information 2012 [place], [place].Norovirus Infection Norovirus illness is caused by a viral infection. The term norovirus refers to a group of viruses. Any of those viruses can cause norovirus illness. This illness is often referred to by other names such as viral gastroenteritis, stomach flu, and food poisoning. Anyone can get a norovirus infection. People can have the illness multiple times during their lifetime. CAUSES  Norovirus is found in the stool or vomit of infected people. It is easily spread from person to person (contagious). People with norovirus are contagious from the moment they begin feeling ill. They may remain contagious for as long as 3 days to 2 weeks after recovery. People can become infected with the virus in several ways. This includes:  Eating food or drinking liquids that are contaminated with norovirus.   Touching surfaces or objects contaminated with norovirus, and then placing your hand in your mouth.   Having direct contact with a person who is infected and shows symptoms. This may occur while caring for someone with illness or while sharing foods or eating utensils with someone who is ill.  SYMPTOMS  Symptoms usually begin 1 to 2 days after ingestion of the virus. Symptoms may include:  Nausea.   Vomiting.   Diarrhea.   Stomach cramps.   Low-grade fever.   Chills.   Headache.   Muscle aches.   Tiredness.  Most people with norovirus illness  get better within 1 to 2 days. Some people become dehydrated because they cannot drink enough liquids to replace those lost from vomiting and diarrhea. This is especially true for young children, the elderly, and others who are unable to care for themselves. DIAGNOSIS  Diagnosis is based on your symptoms and exam. Currently, only state public health laboratories have the ability to test for norovirus in stool or vomit. TREATMENT  No specific treatment exists for norovirus infections. No vaccine is available to prevent infections. Norovirus illness is usually brief in healthy people. If you are ill with vomiting and diarrhea, you should drink enough water and fluids to keep your urine clear or pale yellow. Dehydration is the most serious health effect that can result from this infection. By drinking oral rehydration solution (ORS), people can reduce their chance of becoming dehydrated. There are many commercially available pre-made and powdered ORS designed to safely rehydrate people. These may be recommended by your caregiver. Replace any new fluid losses from diarrhea or vomiting with ORS as follows:  If your child weighs 10 kg or less (22 lb or less), give 60 to 120 ml ( to  cup or 2 to 4 oz) of ORS for each diarrheal stool or  vomiting episode.   If your child weighs more than 10 kg (more than 22 lb), give 120 to 240 ml ( to 1 cup or 4 to 8 oz) of ORS for each diarrheal stool or vomiting episode.  HOME CARE INSTRUCTIONS   Follow all your caregiver's instructions.   Avoid sugar-free and alcoholic drinks while ill.   Only take over-the-counter or prescription medicines for pain, vomiting, diarrhea, or fever as directed by your caregiver.  You can decrease your chances of coming in contact with norovirus or spreading it by following these steps:  Frequently wash your hands, especially after using the toilet, changing diapers, and before eating or preparing food.   Carefully wash fruits and  vegetables. Cook shellfish before eating them.   Do not prepare food for others while you are infected and for at least 3 days after recovering from illness.   Thoroughly clean and disinfect contaminated surfaces immediately after an episode of illness using a bleach-based household cleaner.   Immediately remove and wash clothing or linens that may be contaminated with the virus.   Use the toilet to dispose of any vomit or stool. Make sure the surrounding area is kept clean.   Food that may have been contaminated by an ill person should be discarded.  SEEK IMMEDIATE MEDICAL CARE IF:   You develop symptoms of dehydration that do not improve with fluid replacement. This may include:   Excessive sleepiness.   Lack of tears.   Dry mouth.   Dizziness when standing.   Weak pulse.  Document Released: 10/14/2002 Document Revised: 07/13/2011 Document Reviewed: 11/15/2009 [facility] Patient Information 2012 [place], [place].Nausea and Vomiting Nausea is a sick feeling that often comes before throwing up (vomiting). Vomiting is a reflex where stomach contents come out of your mouth. Vomiting can cause severe loss of body fluids (dehydration). Children and elderly adults can become dehydrated quickly, especially if they also have diarrhea. Nausea and vomiting are symptoms of a condition or disease. It is important to find the cause of your symptoms. CAUSES   Direct irritation of the stomach lining. This irritation can result from increased acid production (gastroesophageal reflux disease), infection, food poisoning, taking certain medicines (such as nonsteroidal anti-inflammatory drugs), alcohol use, or tobacco use.   Signals from the brain.These signals could be caused by a headache, heat exposure, an inner ear disturbance, increased pressure in the brain from injury, infection, a tumor, or a concussion, pain, emotional stimulus, or metabolic problems.   An obstruction in the gastrointestinal  tract (bowel obstruction).   Illnesses such as diabetes, hepatitis, gallbladder problems, appendicitis, kidney problems, cancer, sepsis, atypical symptoms of a heart attack, or eating disorders.   Medical treatments such as chemotherapy and radiation.   Receiving medicine that makes you sleep (general anesthetic) during surgery.  DIAGNOSIS Your caregiver may ask for tests to be done if the problems do not improve after a few days. Tests may also be done if symptoms are severe or if the reason for the nausea and vomiting is not clear. Tests may include:  Urine tests.   Blood tests.   Stool tests.   Cultures (to look for evidence of infection).   X-rays or other imaging studies.  Test results can help your caregiver make decisions about treatment or the need for additional tests. TREATMENT You need to stay well hydrated. Drink frequently but in small amounts.You may wish to drink water, sports drinks, clear broth, or eat frozen ice pops or gelatin dessert to help  stay hydrated.When you eat, eating slowly may help prevent nausea.There are also some antinausea medicines that may help prevent nausea. HOME CARE INSTRUCTIONS   Take all medicine as directed by your caregiver.   If you do not have an appetite, do not force yourself to eat. However, you must continue to drink fluids.   If you have an appetite, eat a normal diet unless your caregiver tells you differently.   Eat a variety of complex carbohydrates (rice, wheat, potatoes, bread), lean meats, yogurt, fruits, and vegetables.   Avoid high-fat foods because they are more difficult to digest.   Drink enough water and fluids to keep your urine clear or pale yellow.   If you are dehydrated, ask your caregiver for specific rehydration instructions. Signs of dehydration may include:   Severe thirst.   Dry lips and mouth.   Dizziness.   Dark urine.   Decreasing urine frequency and amount.   Confusion.   Rapid breathing  or pulse.  SEEK IMMEDIATE MEDICAL CARE IF:   You have blood or brown flecks (like coffee grounds) in your vomit.   You have black or bloody stools.   You have a severe headache or stiff neck.   You are confused.   You have severe abdominal pain.   You have chest pain or trouble breathing.   You do not urinate at least once every 8 hours.   You develop cold or clammy skin.   You continue to vomit for longer than 24 to 48 hours.   You have a fever.  MAKE SURE YOU:   Understand these instructions.   Will watch your condition.   Will get help right away if you are not doing well or get worse.  Document Released: 07/24/2005 Document Revised: 07/13/2011 Document Reviewed: 12/21/2010 [facility] Patient Information 2012 [place], [place].

## 2011-11-27 NOTE — MAU Note (Signed)
Been throwing up and pooping since 1300- just came out of nowhere,  No one else at home is sick.  abd is starting to hurt, 'like she's been throwing up".  Been sweating and then she gets cold.

## 2011-11-28 NOTE — MAU Provider Note (Signed)
Agree with above note.  Karen Rivera 11/28/2011 6:53 AM

## 2013-04-27 IMAGING — US US OB DETAIL+14 WK
1 series · 14 of 28 positions shown · non-contrast
Comparison: none

[Series 1: us ob detail+14 wk · 14 of 61 slices shown]
[im 3/61]
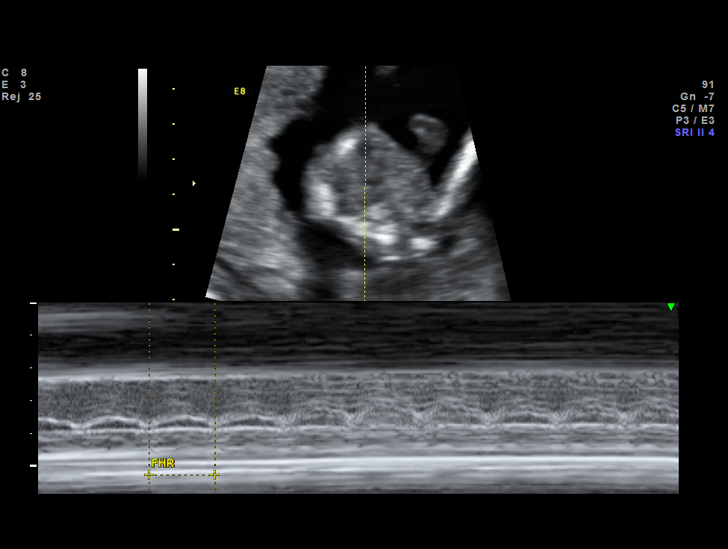
[im 7/61]
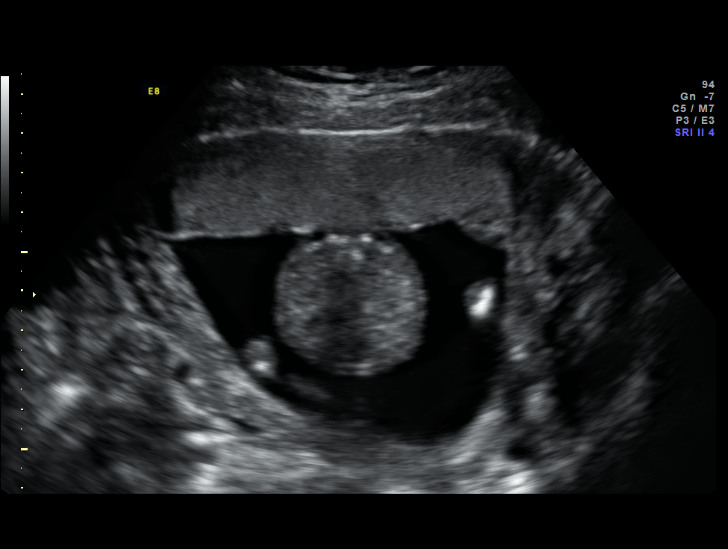
[im 12/61]
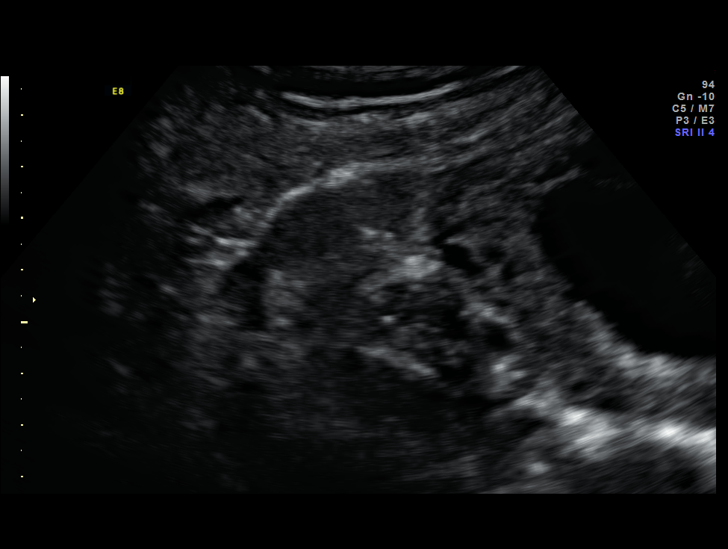
[im 16/61]
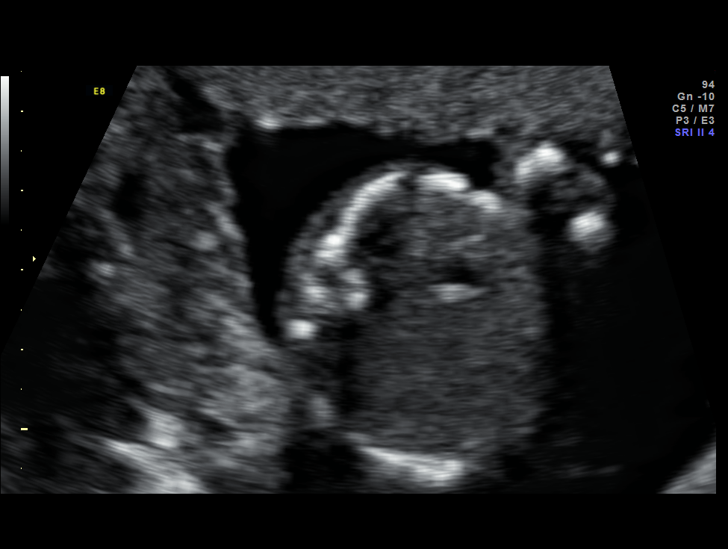
[im 21/61]
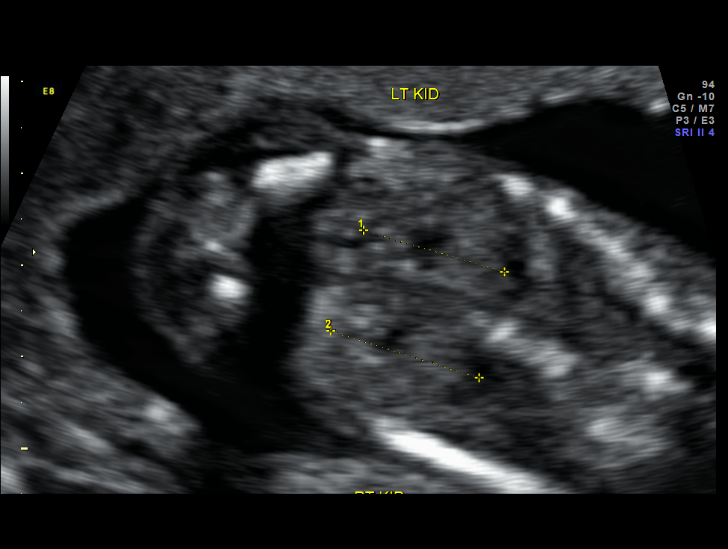
[im 25/61]
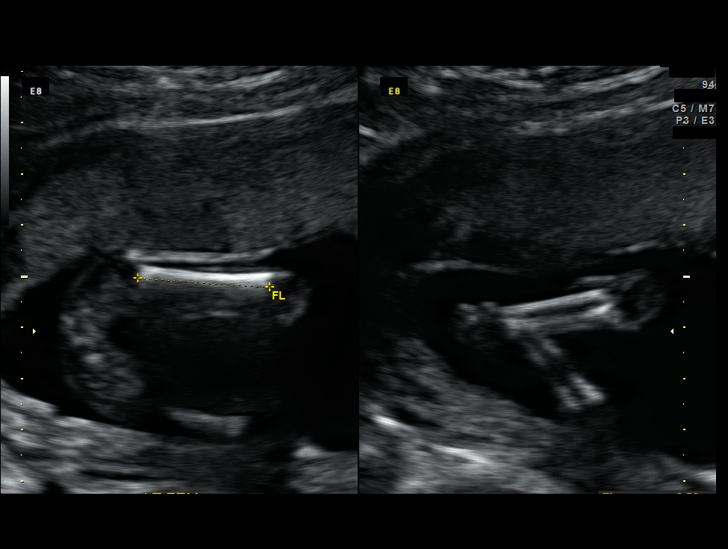
[im 29/61]
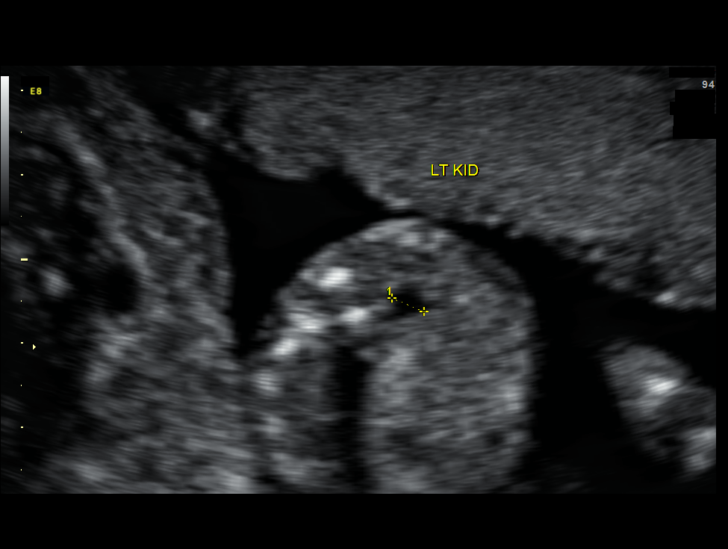
[im 34/61]
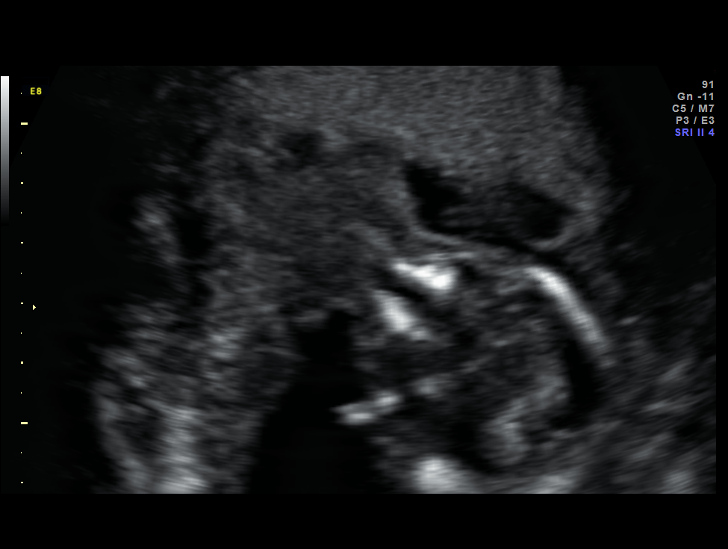
[im 38/61]
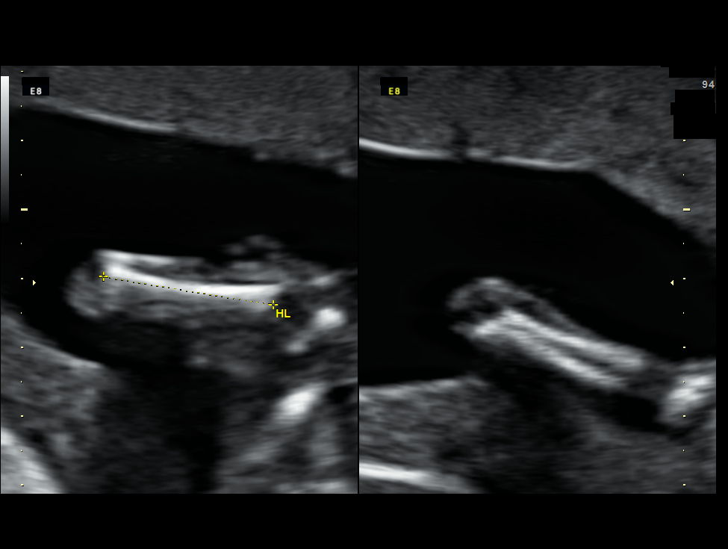
[im 43/61]
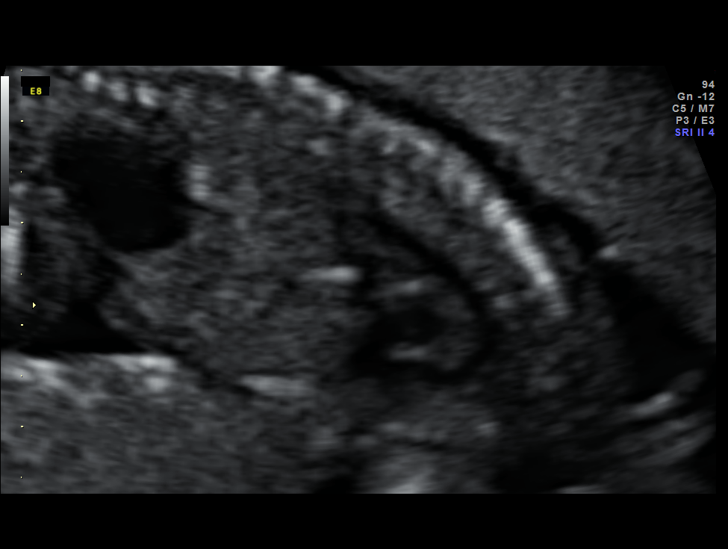
[im 47/61]
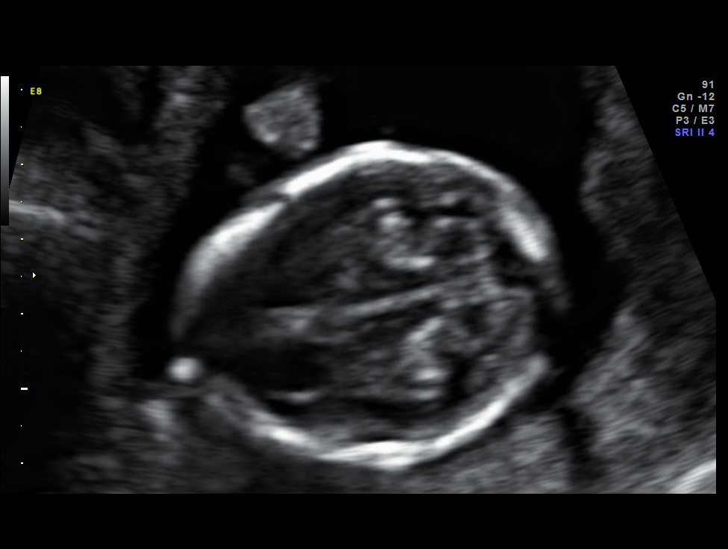
[im 52/61]
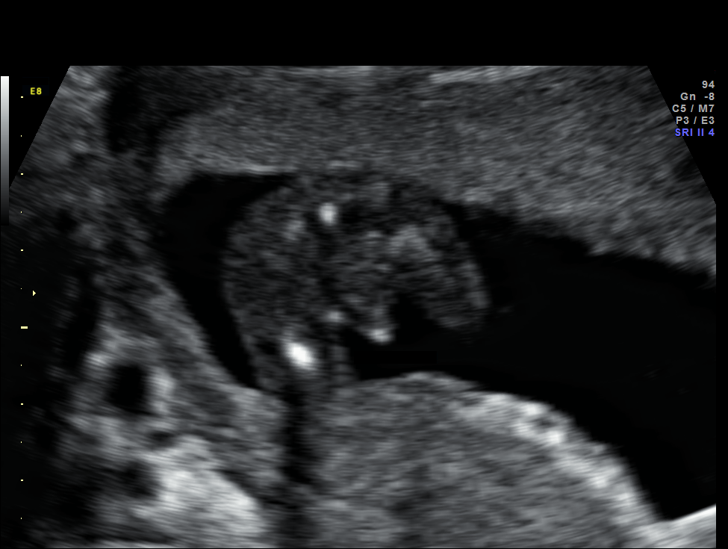
[im 56/61]
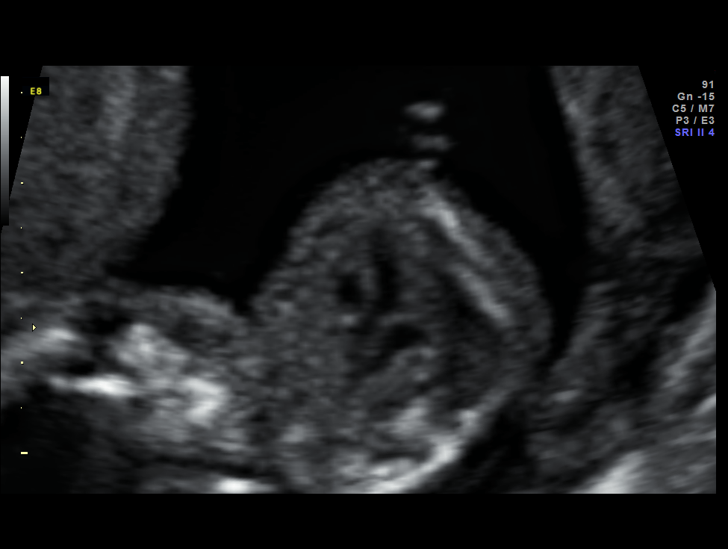
[im 61/61]
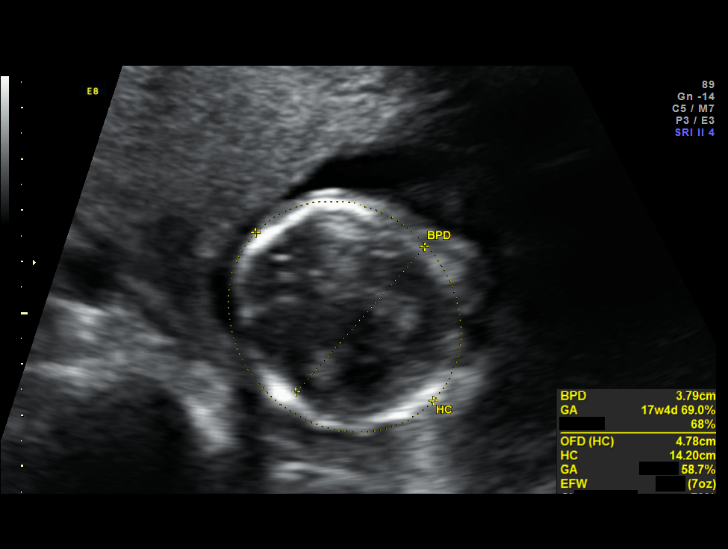

[14 of 28 positions shown; findings below may reference images not displayed]

Canned report from images found in remote index.

Refer to host system for actual result text.

## 2013-08-17 IMAGING — US US OB FOLLOW-UP
1 series · 14 of 28 positions shown · non-contrast
Comparison: none

[Series 1: us ob follow-up · 14 of 44 slices shown]
[im 2/44]
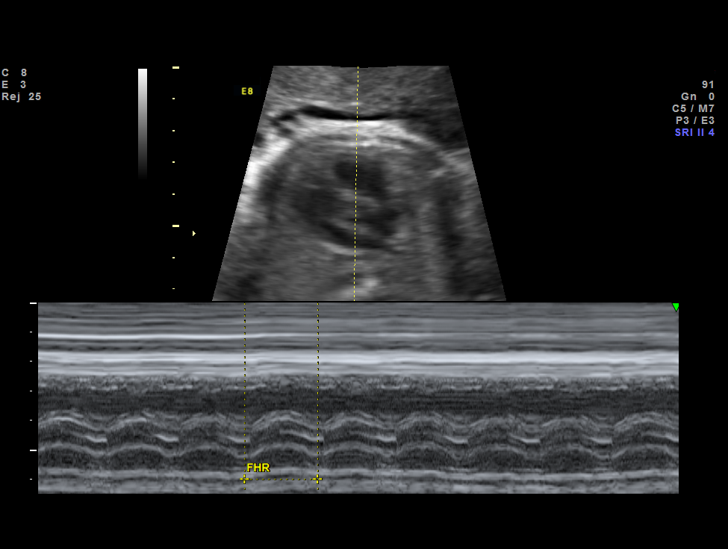
[im 5/44]
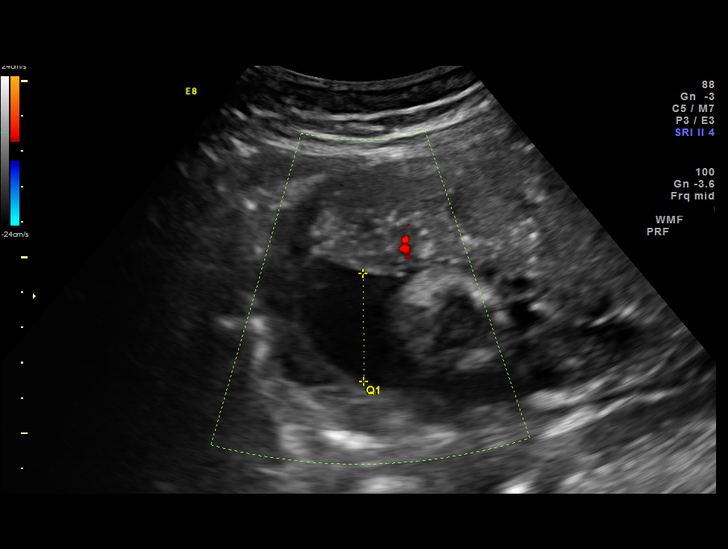
[im 8/44]
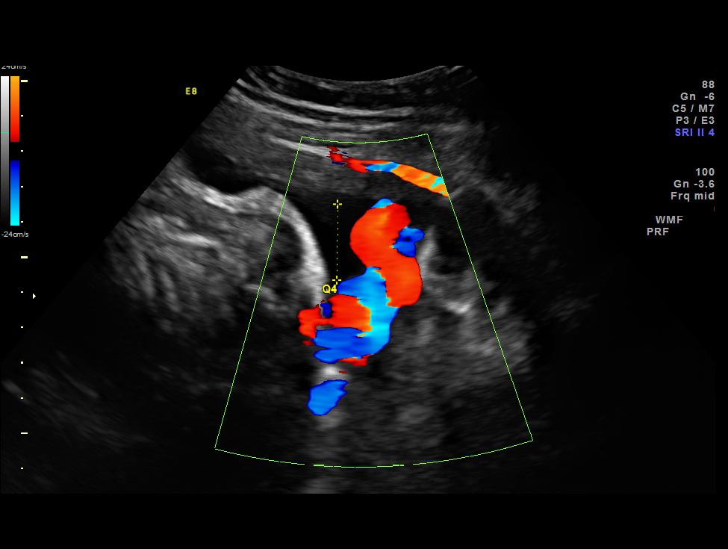
[im 12/44]
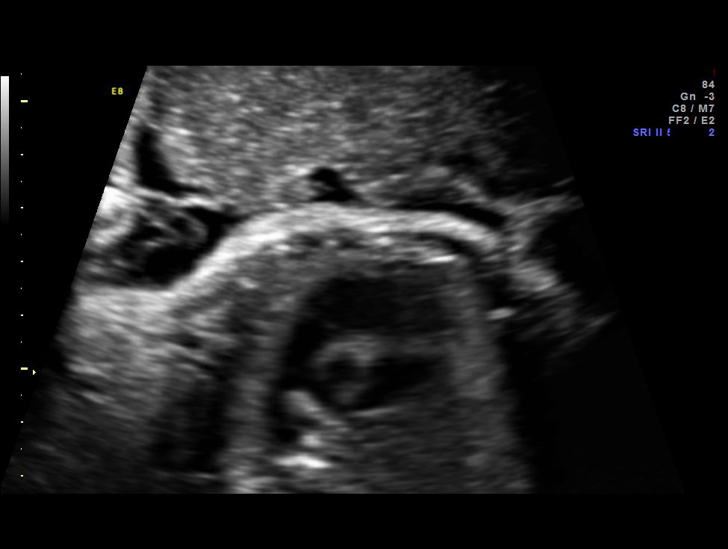
[im 15/44]
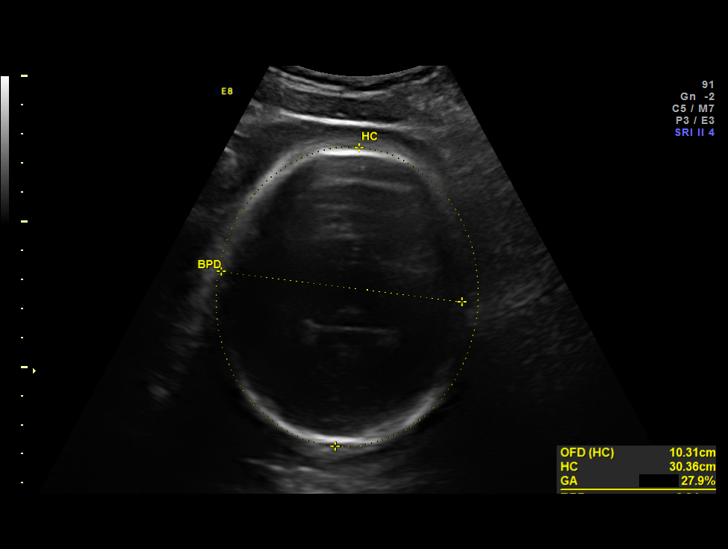
[im 18/44]
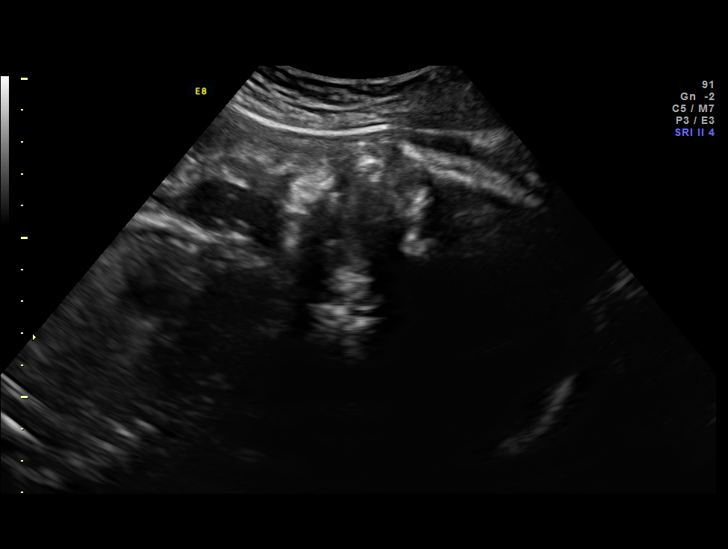
[im 21/44]
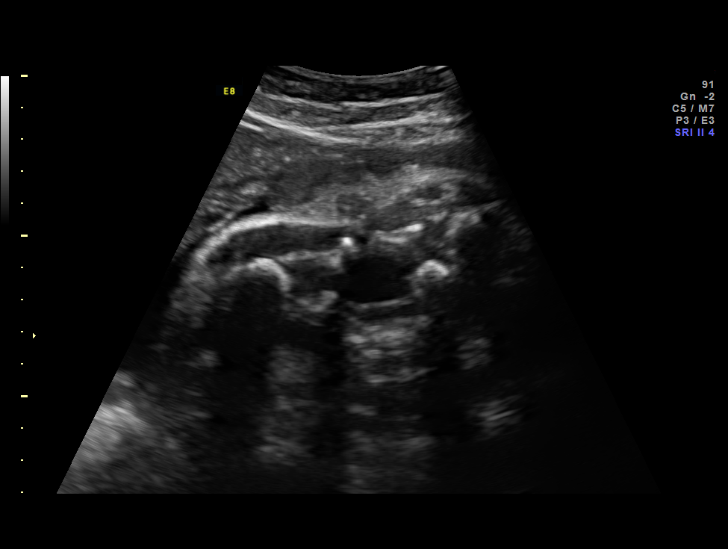
[im 24/44]
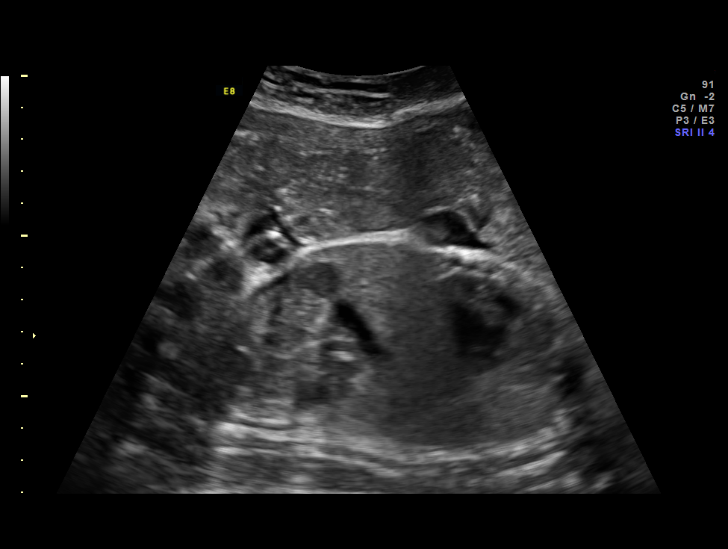
[im 28/44]
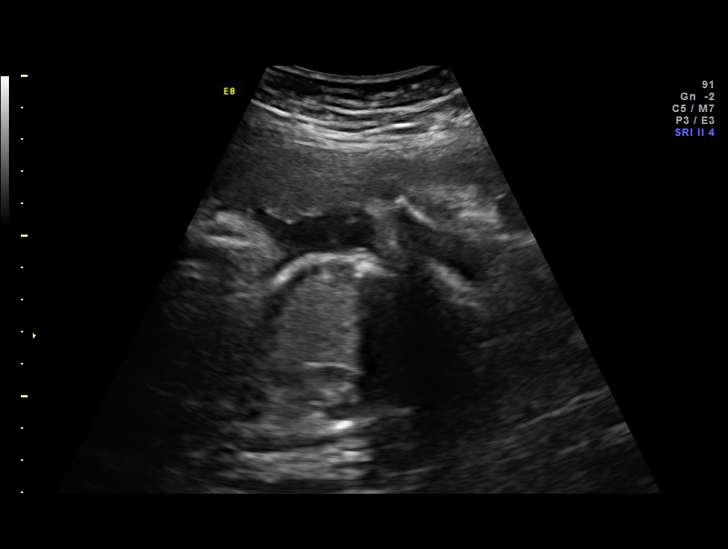
[im 31/44]
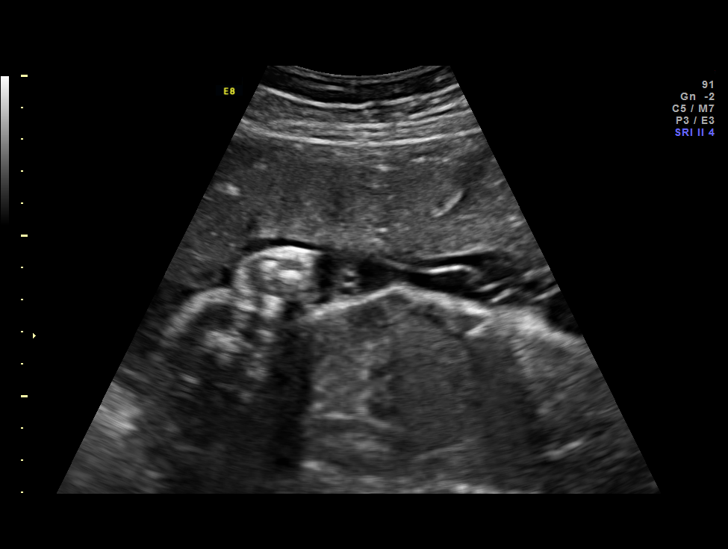
[im 34/44]
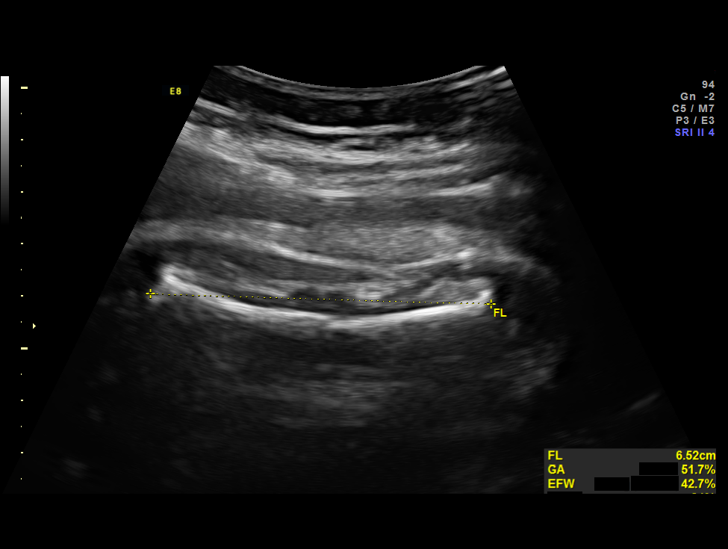
[im 37/44]
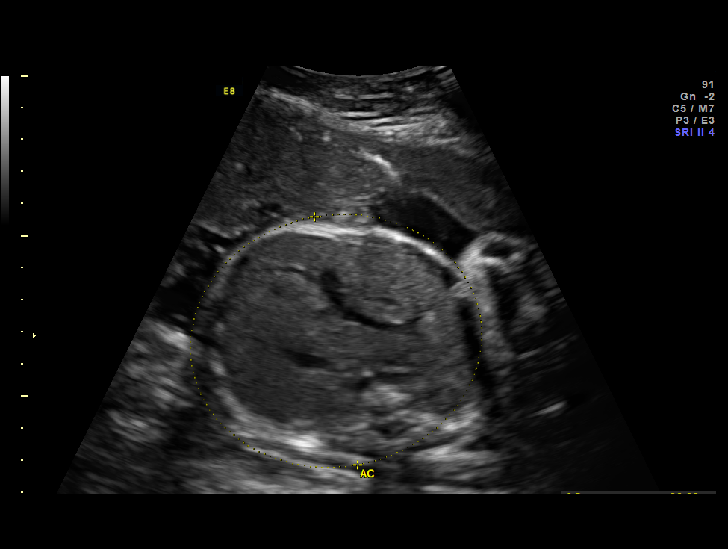
[im 40/44]
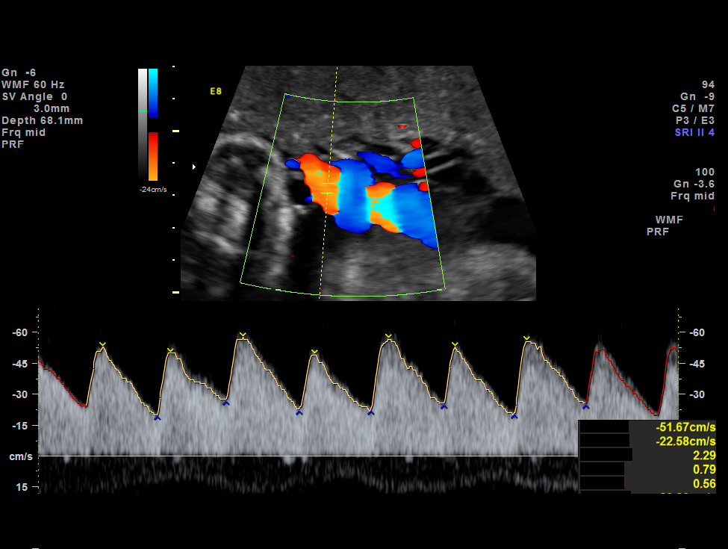
[im 44/44]
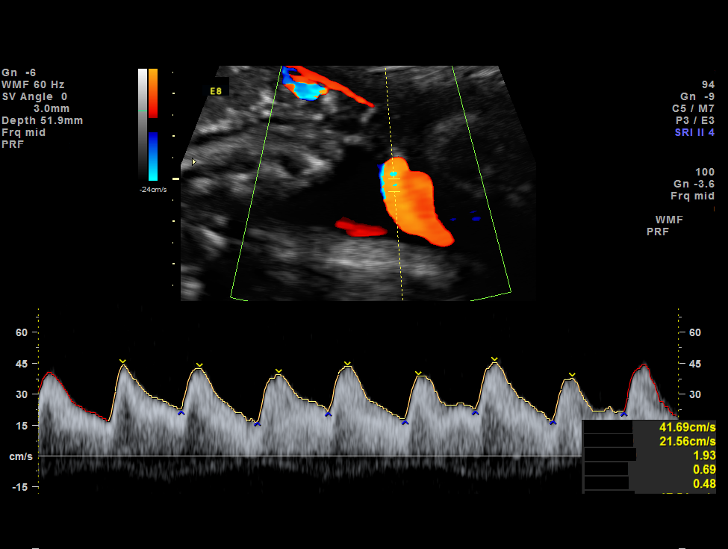

[14 of 28 positions shown; findings below may reference images not displayed]

Canned report from images found in remote index.

Refer to host system for actual result text.

## 2013-12-22 ENCOUNTER — Ambulatory Visit (INDEPENDENT_AMBULATORY_CARE_PROVIDER_SITE_OTHER): Payer: Medicaid Other | Admitting: Cardiology

## 2013-12-22 ENCOUNTER — Encounter: Payer: Self-pay | Admitting: Cardiology

## 2013-12-22 VITALS — BP 113/73 | HR 77 | Ht 67.0 in | Wt 133.0 lb

## 2013-12-22 DIAGNOSIS — R079 Chest pain, unspecified: Secondary | ICD-10-CM

## 2013-12-22 NOTE — Patient Instructions (Signed)
Your physician recommends that you continue on your current medications as directed. Please refer to the Current Medication list given to you today.  Your physician recommends that you schedule a follow-up as needed. 
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
## 2014-05-04 ENCOUNTER — Encounter (HOSPITAL_COMMUNITY): Payer: Self-pay | Admitting: Emergency Medicine

## 2014-05-04 ENCOUNTER — Emergency Department (HOSPITAL_COMMUNITY)
Admission: EM | Admit: 2014-05-04 | Discharge: 2014-05-04 | Disposition: A | Discharge status: Home / Self Care | Payer: Medicaid Other | Source: Home / Self Care

## 2014-05-04 ENCOUNTER — Emergency Department (INDEPENDENT_AMBULATORY_CARE_PROVIDER_SITE_OTHER): Payer: Medicaid Other

## 2014-05-04 DIAGNOSIS — S60051A Contusion of right little finger without damage to nail, initial encounter: Secondary | ICD-10-CM

## 2014-05-04 DIAGNOSIS — S61209A Unspecified open wound of unspecified finger without damage to nail, initial encounter: Secondary | ICD-10-CM

## 2014-05-04 DIAGNOSIS — W230XXA Caught, crushed, jammed, or pinched between moving objects, initial encounter: Secondary | ICD-10-CM

## 2014-05-04 DIAGNOSIS — S61309A Unspecified open wound of unspecified finger with damage to nail, initial encounter: Secondary | ICD-10-CM

## 2014-05-04 DIAGNOSIS — S60041A Contusion of right ring finger without damage to nail, initial encounter: Secondary | ICD-10-CM

## 2014-05-04 DIAGNOSIS — S6000XA Contusion of unspecified finger without damage to nail, initial encounter: Secondary | ICD-10-CM
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
## 2014-05-04 NOTE — ED Notes (Signed)
Reports  She   Slammed  Her  r pinky  In a  Car  Door   Last  Pm  She  Has nail bed  Involvement       She  Pas  Pain     At this  Time

## 2014-05-04 NOTE — Discharge Instructions (Signed)
Fingernail or Toenail Loss All or part of your fingernail or toenail has been lost. This may or may not grow back as a normal nail. A special non-stick bandage has been put on your finger or toe tightly to prevent bleeding. HOME CARE INSTRUCTIONS  The tips of fingers and toes are full of nerves and injuries are often very painful. The following will help you decrease the pain and obtain the best outcome.  Keep your hand or foot elevated above your heart to relieve pain and swelling. This will require lying in bed or on a couch with the hand or leg on pillows or sitting in a recliner with the leg up. Letting your hand or leg dangle may increase swelling, slow healing and cause throbbing pain.  Keep your dressing dry and clean.  Change your bandage in 24 hours after going home.  After your bandage is changed, soak your hand or foot in warm soapy water for 10 to 20 minutes. Do this 3 times per day. This helps reduce pain and swelling. After soaking, apply a clean, dry bandage. Change your bandage if it is wet or dirty.  Only take over-the-counter or prescription medicines for pain, discomfort, or fever as directed by your caregiver.  See your caregiver as needed for problems. SEEK IMMEDIATE MEDICAL CARE IF:   You have increased pain, swelling, drainage, or bleeding.  You have a fever. MAKE SURE YOU:   Understand these instructions.  Will watch your condition.  Will get help right away if you are not doing well or get worse. Document Released: 06/15/2006 Document Revised: 10/16/2011 Document Reviewed: 09/04/2006 [facility] Patient Information 2015 [place], [place]. This information is not intended to replace advice given to you by your health care provider. Make sure you discuss any questions you have with your health care provider.  Nail Bed Injury The nail bed is the soft tissue under a fingernail or toenail that is the origin for new nail growth. Various types of injuries can occur at the  nail bed. These injuries may involve bruising or bleeding under the nail, cuts (lacerations) in the nail or nail bed, or loss of a part of the nail or the whole nail (avulsion). In some cases, a nail bed injury accompanies another injury, such as a break (fracture) of the bone at the tip of the finger or toe. Nail bed injuries are common in people who have jobs that require performing manual tasks with their hands, such as carpenters and landscapers.  The nail bed includes the growth center of the nail. If this growth center is damaged, the injured nail may not grow back normally if at all. The regrown nail might have an abnormal shape or appearance. It can take several months for a damaged or torn-off nail to regrow. Depending on the nature and extent of the nail bed injury, there may be a permanent disruption of normal nail growth. CAUSES  Damage to the nail bed area is usually caused by crushing, pinching, cutting, or tearing injuries of the fingertip or toe. For example, these injuries may occur when a fingertip gets caught in a door, hit by a hammer, or damaged in accidents involving electrical tools or power machinery.  SYMPTOMS  Symptoms vary depending on the nature of the injury. Symptoms may include:  Pain in the injured area.  Bleeding.  Swelling.  Discoloration.  Collection of blood under the nail (hematoma).  Deformed or split nail.  Loose nail (not stuck to the nail bed).  Loss of all or part of the nail. DIAGNOSIS  Your caregiver will take a medical history and examine the injured area. You will be asked to describe how the injury occurred. X-rays may be done to see if you have a fracture. Your caregiver might also check for conditions that may affect healing, such as diabetes, nerve problems, or poor circulation.  TREATMENT  Treatment depends on the type of injury.  The injury may not require any special treatment other than keeping the area clean and free of infection.    Your caregiver may drain the collection of blood from under the nail. This can be done by making a small hole in the nail.   Your caregiver may remove all or part of your nail. This might be necessary to stitch (suture) any laceration in the nail bed. Before doing this, the caregiver will likely give you medication to numb the nail area (local anesthetic). In some cases, the caregiver may choose to numb the entire finger or toe (digital nerve block). Depending on the location and size of the nail bed injury, an avulsed nail is sometimes stitched back in place to provide temporary protection to the nail bed until the new nail grows in.  Your caregiver may apply bandages (dressings) or splints to the area.  You might be prescribed antibiotic medication to help prevent infection.  For certain injuries, your caregiver may direct you to see a hand or foot specialist.  You may need a tetanus shot if:  You cannot remember when you had your last tetanus shot.  You have never had a tetanus shot.  The injury broke your skin. If you get a tetanus shot, your arm may swell, get red, and feel warm to the touch. This is common and not a problem. If you need a tetanus shot and you choose not to have one, there is a rare chance of getting tetanus. Sickness from tetanus can be serious. HOME CARE INSTRUCTIONS   Keep your hand or foot raised (elevated) to relieve pain and swelling.   For an injured toenail, lie in bed or on a couch with your leg on pillows. You can also sit in a recliner with your leg up. Avoid walking or letting your leg dangle. When you walk, wear an open-toe shoe.  For an injured fingernail, keep your hand above the level of your heart. Use pillows on a table or on the arm of your chair while sitting. Use them on your bed while sleeping.   Keep your injury protected with dressings or splints as directed by your caregiver.   Keep any dressings clean and dry. Change or remove your  dressings as directed by your caregiver.   Only take over-the-counter or prescription medications as directed by your caregiver. If you were prescribed antibiotics, take them as directed. Finish them even if you start to feel better.   Follow up with your caregiver as directed.  SEEK MEDICAL CARE IF:   You have pain that is not controlled with medication.   You have any problems caring for your injury.  SEEK IMMEDIATE MEDICAL CARE IF:   You have increased pain, drainage, or bleeding in the injured area.   You have redness, soreness, and swelling (inflammation) in the injured area.  You have a fever or persistent symptoms for more than 2-3 days.  You have a fever and your symptoms suddenly get worse.  You have swelling that spreads from your finger into your hand or from  your toe into your foot.  MAKE SURE YOU:  Understand these instructions.  Will watch your condition.  Will get help right away if you are not doing well or get worse. Document Released: 08/31/2004 Document Revised: 11/18/2012 Document Reviewed: 08/15/2012 [facility] Patient Information 2015 [place], [place]. This information is not intended to replace advice given to you by your health care provider. Make sure you discuss any questions you have with your health care provider.  Wound Care Wound care helps prevent pain and infection.  You may need a tetanus shot if:  You cannot remember when you had your last tetanus shot.  You have never had a tetanus shot.  The injury broke your skin. If you need a tetanus shot and you choose not to have one, you may get tetanus. Sickness from tetanus can be serious. HOME CARE   Only take medicine as told by your doctor.  Clean the wound daily with mild soap and water.  Change any bandages (dressings) as told by your doctor.  Put medicated cream and a bandage on the wound as told by your doctor.  Change the bandage if it gets wet, dirty, or starts to  smell.  Take showers. Do not take baths, swim, or do anything that puts your wound under water.  Rest and raise (elevate) the wound until the pain and puffiness (swelling) are better.  Keep all doctor visits as told. GET HELP RIGHT AWAY IF:   Yellowish-white fluid (pus) comes from the wound.  Medicine does not lessen your pain.  There is a red streak going away from the wound.  You have a fever. MAKE SURE YOU:   Understand these instructions.  Will watch your condition.  Will get help right away if you are not doing well or get worse. Document Released: 05/02/2008 Document Revised: 10/16/2011 Document Reviewed: 11/27/2010 [facility] Patient Information 2015 [place], [place]. This information is not intended to replace advice given to you by your health care provider. Make sure you discuss any questions you have with your health care provider.

## 2014-05-05 NOTE — ED Provider Notes (Signed)
Medical screening examination/treatment/procedure(s) were performed by a resident physician or non-physician practitioner and as the supervising physician I was immediately available for consultation/collaboration.  David Merrell, MD Family Medicine   David J Merrell, MD 05/05/14 1116 

## 2014-05-31 ENCOUNTER — Inpatient Hospital Stay (HOSPITAL_COMMUNITY)
Admission: AD | Admit: 2014-05-31 | Discharge: 2014-05-31 | Disposition: A | Discharge status: Home / Self Care | Payer: Medicaid Other | Source: Ambulatory Visit | Attending: Obstetrics & Gynecology | Admitting: Obstetrics & Gynecology

## 2014-05-31 ENCOUNTER — Encounter (HOSPITAL_COMMUNITY): Payer: Self-pay

## 2014-05-31 DIAGNOSIS — Z793 Long term (current) use of hormonal contraceptives: Secondary | ICD-10-CM | POA: Diagnosis not present

## 2014-05-31 DIAGNOSIS — N912 Amenorrhea, unspecified: Secondary | ICD-10-CM | POA: Diagnosis present

## 2014-05-31 DIAGNOSIS — Z79899 Other long term (current) drug therapy: Secondary | ICD-10-CM

## 2014-05-31 LAB — POCT PREGNANCY, URINE: PREG TEST UR: NEGATIVE

## 2014-05-31 LAB — URINALYSIS, ROUTINE W REFLEX MICROSCOPIC
BILIRUBIN URINE: NEGATIVE
GLUCOSE, UA: NEGATIVE mg/dL
HGB URINE DIPSTICK: NEGATIVE
Ketones, ur: NEGATIVE mg/dL
Leukocytes, UA: NEGATIVE
Nitrite: NEGATIVE
PH: 6.5 (ref 5.0–8.0)
Protein, ur: NEGATIVE mg/dL
SPECIFIC GRAVITY, URINE: 1.02 (ref 1.005–1.030)
Urobilinogen, UA: 0.2 mg/dL (ref 0.0–1.0)

## 2014-05-31 MED ORDER — ULIPRISTAL ACETATE 30 MG PO TABS: 1.0000 | ORAL_TABLET | Freq: Once | ORAL | Status: AC

## 2014-05-31 NOTE — Discharge Instructions (Signed)

## 2014-05-31 NOTE — MAU Note (Signed)
Patient states her period is late, she is having pregnancy symptoms, and thinks she may be pregnant.
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
## 2014-05-31 NOTE — MAU Provider Note (Signed)

## 2014-06-08 ENCOUNTER — Encounter (HOSPITAL_COMMUNITY): Payer: Self-pay

## 2014-06-17 ENCOUNTER — Encounter (HOSPITAL_COMMUNITY): Payer: Self-pay | Admitting: *Deleted

## 2014-06-17 ENCOUNTER — Inpatient Hospital Stay (HOSPITAL_COMMUNITY)
Admission: AD | Admit: 2014-06-17 | Discharge: 2014-06-17 | Disposition: A | Discharge status: Home / Self Care | Payer: Medicaid Other | Source: Ambulatory Visit | Attending: Obstetrics & Gynecology | Admitting: Obstetrics & Gynecology

## 2014-06-17 DIAGNOSIS — N911 Secondary amenorrhea: Secondary | ICD-10-CM | POA: Diagnosis not present

## 2014-06-17 DIAGNOSIS — Z3202 Encounter for pregnancy test, result negative: Secondary | ICD-10-CM | POA: Diagnosis present

## 2014-06-17 LAB — HCG, QUANTITATIVE, PREGNANCY: hCG, Beta Chain, Quant, S: <1 m[IU]/mL (ref <5)

## 2014-06-17 LAB — URINALYSIS, ROUTINE W REFLEX MICROSCOPIC
BILIRUBIN URINE: NEGATIVE
Glucose, UA: NEGATIVE mg/dL
HGB URINE DIPSTICK: NEGATIVE
KETONES UR: NEGATIVE mg/dL
Leukocytes, UA: NEGATIVE
Nitrite: NEGATIVE
PH: 6.5 (ref 5.0–8.0)
Protein, ur: NEGATIVE mg/dL
Specific Gravity, Urine: 1.02 (ref 1.005–1.030)
Urobilinogen, UA: 0.2 mg/dL (ref 0.0–1.0)

## 2014-06-17 LAB — POCT PREGNANCY, URINE: Preg Test, Ur: NEGATIVE

## 2014-06-17 NOTE — MAU Note (Signed)
Patient states she was seen in MAU 2 weeks ago and was given the morning after pill which she took. States she has been having abdominal pain for several weeks and still has not started her period. States she is gaining weight. Wants a blood pregnancy test.

## 2014-06-17 NOTE — MAU Note (Signed)
Pt states she thinks she is pregnant. Pt states 05/31/2014, pt states she had 3 hpt that was negative. Pt states she was having abdominal pain and back pain and "still has not seen my period"
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
## 2014-06-17 NOTE — Discharge Instructions (Signed)
Pregnancy Tests °HOW DO PREGNANCY TESTS WORK? °All pregnancy tests look for a special hormone in the urine or blood that is only present in pregnant women. This hormone, human chorionic gonadotropin (hCG), is also called the pregnancy hormone.  °WHAT IS THE DIFFERENCE BETWEEN A URINE AND A BLOOD PREGNANCY TEST? IS ONE BETTER THAN THE OTHER? °There are two types of pregnancy tests. °· Blood tests. °· Urine tests. °Both tests look for the presence of hCG, the pregnancy hormone. Many women use a urine test or home pregnancy test (HPT) to find out if they are pregnant. HPTs are cheap, easy to use, can be done at home, and are private. When a woman has a positive result on an HPT, she needs to see her caregiver right away. The caregiver can confirm a positive HPT result with another urine test, a blood test, ultrasound, and a pelvic exam.  °There are two types of blood tests you can get from a caregiver.  °· A quantitative blood test (or the beta hCG test). This test measures the exact amount of hCG in the blood. This means it can pick up very small amounts of hCG, making it a very accurate test. °· A qualitative hCG blood test. This test gives a simple yes or no answer to whether you are pregnant. This test is more like a urine test in terms of its accuracy. °Blood tests can pick up hCG earlier in a pregnancy than urine tests can. Blood tests can tell if you are pregnant about 6 to 8 days after you release an egg from an ovary (ovulate). Urine tests can determine pregnancy about 2 weeks after ovulation.  °HOW IS A HOME PREGNANCY TEST DONE?  °There are many types of home pregnancy tests or HPTs that can be bought over-the-counter at drug or discount stores.  °· Some involve collecting your urine in a cup and dipping a stick into the urine or putting some of the urine into a special container with an eyedropper. °· Others are done by placing a stick into your urine stream. °· Tests vary in how long you need to wait for  the stick or container to turn a certain color or have a symbol on it (like a plus or a minus). °· All tests come with written instructions. Most tests also have toll-free phone numbers to call if you have any questions about how to do the test or read the results. °HOW ACCURATE ARE HOME PREGNANCY TESTS?  °HPTs are very accurate. Most brands of HPTs say they are 97% to 99% accurate when taken 1 week after missing your menstrual period, but this can vary with actual use. Each brand varies in how sensitive it is in picking up the pregnancy hormone hCG. If a test is not done correctly, it will be less accurate. Always check the package to make sure it is not past its expiration date. If it is, it will not be accurate. Most brands of HPTs tell users to do the test again in a few days, no matter what the results.  °If you use an HPT too early in your pregnancy, you may not have enough of the pregnancy hormone hCG in your urine to have a positive test result. Most HPTs will be accurate if you test yourself around the time your period is due (about 2 weeks after you ovulate). You can get a negative test result if you are not pregnant or if you ovulated later than you thought you did.   You may also have problems with the pregnancy, which affects the amount of hCG you have in your urine. If your HPT is negative, test yourself again within a few days to 1 week. If you keep getting a negative result and think you are pregnant, talk with your caregiver right away about getting a blood pregnancy test.  °FALSE POSITIVE PREGNANCY TEST °A false positive HPT can happen if there is blood or protein present in your urine. A false positive can also happen if you were recently pregnant or if you take a pregnancy test too soon after taking fertility drug that contains hCG. Also, some prescription medicines such as water pills (diuretics), tranquilizers, seizure medicines, psychiatric medicines, and allergy and nausea medicines  (promethazine) give false positive readings. °FALSE NEGATIVE PREGNANCY TEST °· A false negative HPT can happen if you do the test too early. Try to wait until you are at least 1 day late for your menstrual period. °· It may happen if you wait too long to test the urine (longer than 15 minutes). °· It may also happen if the urine is too diluted because you drank a lot of fluids before getting the urine sample. It is best to test the first morning urine after you get out of bed. °If your menstrual period did not start after a week of a negative HPT, repeat the pregnancy test. °CAN ANYTHING INTERFERE WITH HOME PREGNANCY TEST RESULTS?  °Most medicines, both over-the-counter and prescription drugs, including birth control pills and antibiotics, should not affect the results of a HPT. Only those drugs that have the pregnancy hormone hCG in them can give a false positive test result. Drugs that have hCG in them may be used for treating infertility (not being able to get pregnant). Alcohol and illegal drugs do not affect HPT results, but you should not be using these substances if you are trying to get pregnant. If you have a positive pregnancy test, call your caregiver to make an appointment to begin prenatal care. °Document Released: 07/27/2003 Document Revised: 10/16/2011 Document Reviewed: 11/07/2013 °ExitCare® Patient Information ©2015 [employer]. This information is not intended to replace advice given to you by your health care provider. Make sure you discuss any questions you have with your health care provider. ° °

## 2014-10-08 ENCOUNTER — Emergency Department (HOSPITAL_COMMUNITY): Payer: Medicaid Other

## 2014-10-08 ENCOUNTER — Emergency Department (HOSPITAL_COMMUNITY)
Admission: EM | Admit: 2014-10-08 | Discharge: 2014-10-08 | Disposition: A | Discharge status: Home / Self Care | Payer: Medicaid Other | Attending: Emergency Medicine | Admitting: Emergency Medicine

## 2014-10-08 ENCOUNTER — Encounter (HOSPITAL_COMMUNITY): Payer: Self-pay | Admitting: Emergency Medicine

## 2014-10-08 DIAGNOSIS — J45901 Unspecified asthma with (acute) exacerbation: Secondary | ICD-10-CM | POA: Insufficient documentation

## 2014-10-08 DIAGNOSIS — Z79899 Other long term (current) drug therapy: Secondary | ICD-10-CM | POA: Insufficient documentation

## 2014-10-08 MED ORDER — PREDNISONE 20 MG PO TABS: 40.0000 mg | ORAL_TABLET | Freq: Every day | ORAL | Status: AC

## 2014-10-08 MED ORDER — AEROCHAMBER PLUS W/MASK MISC
1.0000 | Freq: Once | Status: AC
  Administered 2014-10-08: 1
  Filled 2014-10-08: qty 1

## 2014-10-08 MED ORDER — IPRATROPIUM-ALBUTEROL 0.5-2.5 (3) MG/3ML IN SOLN
3.0000 mL | Freq: Once | RESPIRATORY_TRACT | Status: AC
  Administered 2014-10-08: 3 mL via RESPIRATORY_TRACT

## 2014-10-08 MED ORDER — PREDNISONE 20 MG PO TABS
40.0000 mg | ORAL_TABLET | Freq: Once | ORAL | Status: AC
  Administered 2014-10-08: 40 mg via ORAL
  Filled 2014-10-08: qty 2

## 2014-10-08 MED ORDER — HYDROCODONE-HOMATROPINE 5-1.5 MG/5ML PO SYRP
5.0000 mL | ORAL_SOLUTION | Freq: Once | ORAL | Status: AC
Start: 2014-10-08 — End: 2014-10-08
  Administered 2014-10-08: 5 mL via ORAL
  Filled 2014-10-08: qty 5

## 2014-10-08 MED ORDER — IPRATROPIUM-ALBUTEROL 0.5-2.5 (3) MG/3ML IN SOLN
3.0000 mL | Freq: Once | RESPIRATORY_TRACT | Status: AC
  Administered 2014-10-08: 3 mL via RESPIRATORY_TRACT
  Filled 2014-10-08: qty 3

## 2014-10-08 MED ORDER — IPRATROPIUM-ALBUTEROL 0.5-2.5 (3) MG/3ML IN SOLN
RESPIRATORY_TRACT | Status: AC
  Filled 2014-10-08: qty 3

## 2014-10-08 MED ORDER — ALBUTEROL SULFATE HFA 108 (90 BASE) MCG/ACT IN AERS
2.0000 | INHALATION_SPRAY | Freq: Once | RESPIRATORY_TRACT | Status: AC
  Administered 2014-10-08: 2 via RESPIRATORY_TRACT
  Filled 2014-10-08: qty 6.7

## 2014-10-08 NOTE — Discharge Instructions (Signed)
Please follow up with your primary care physician in 1-2 days. If you do not have one please call the [facility] and wellness Center number listed above. Please use your inhaler 2 puffs every four to six hours as needed. Please take Prednisone as prescribed. Please read all discharge instructions and return precautions.    Asthma Asthma is a recurring condition in which the airways tighten and narrow. Asthma can make it difficult to breathe. It can cause coughing, wheezing, and shortness of breath. Asthma episodes, also called asthma attacks, range from minor to life-threatening. Asthma cannot be cured, but medicines and lifestyle changes can help control it. CAUSES Asthma is believed to be caused by inherited (genetic) and environmental factors, but its exact cause is unknown. Asthma may be triggered by allergens, lung infections, or irritants in the air. Asthma triggers are different for each person. Common triggers include:   Animal dander.  Dust mites.  Cockroaches.  Pollen from trees or grass.  Mold.  Smoke.  Air pollutants such as dust, household cleaners, hair sprays, aerosol sprays, paint fumes, strong chemicals, or strong odors.  Cold air, weather changes, and winds (which increase molds and pollens in the air).  Strong emotional expressions such as crying or laughing hard.  Stress.  Certain medicines (such as aspirin) or types of drugs (such as beta-blockers).  Sulfites in foods and drinks. Foods and drinks that may contain sulfites include dried fruit, potato chips, and sparkling grape juice.  Infections or inflammatory conditions such as the flu, a cold, or an inflammation of the nasal membranes (rhinitis).  Gastroesophageal reflux disease (GERD).  Exercise or strenuous activity. SYMPTOMS Symptoms may occur immediately after asthma is triggered or many hours later. Symptoms include:  Wheezing.  Excessive nighttime or early morning coughing.  Frequent or severe  coughing with a common cold.  Chest tightness.  Shortness of breath. DIAGNOSIS  The diagnosis of asthma is made by a review of your medical history and a physical exam. Tests may also be performed. These may include:  Lung function studies. These tests show how much air you breathe in and out.  Allergy tests.  Imaging tests such as X-rays. TREATMENT  Asthma cannot be cured, but it can usually be controlled. Treatment involves identifying and avoiding your asthma triggers. It also involves medicines. There are 2 classes of medicine used for asthma treatment:   Controller medicines. These prevent asthma symptoms from occurring. They are usually taken every day.  Reliever or rescue medicines. These quickly relieve asthma symptoms. They are used as needed and provide short-term relief. Your health care provider will help you create an asthma action plan. An asthma action plan is a written plan for managing and treating your asthma attacks. It includes a list of your asthma triggers and how they may be avoided. It also includes information on when medicines should be taken and when their dosage should be changed. An action plan may also involve the use of a device called a peak flow meter. A peak flow meter measures how well the lungs are working. It helps you monitor your condition. HOME CARE INSTRUCTIONS   Take medicines only as directed by your health care provider. Speak with your health care provider if you have questions about how or when to take the medicines.  Use a peak flow meter as directed by your health care provider. Record and keep track of readings.  Understand and use the action plan to help minimize or stop an asthma attack  without needing to seek medical care.  Control your home environment in the following ways to help prevent asthma attacks:  Do not smoke. Avoid being exposed to secondhand smoke.  Change your heating and air conditioning filter regularly.  Limit your  use of fireplaces and wood stoves.  Get rid of pests (such as roaches and mice) and their droppings.  Throw away plants if you see mold on them.  Clean your floors and dust regularly. Use unscented cleaning products.  Try to have someone else vacuum for you regularly. Stay out of rooms while they are being vacuumed and for a short while afterward. If you vacuum, use a dust mask from a hardware store, a double-layered or microfilter vacuum cleaner bag, or a vacuum cleaner with a HEPA filter.  Replace carpet with wood, tile, or vinyl flooring. Carpet can trap dander and dust.  Use allergy-proof pillows, mattress covers, and box spring covers.  Wash bed sheets and blankets every week in hot water and dry them in a dryer.  Use blankets that are made of polyester or cotton.  Clean bathrooms and kitchens with bleach. If possible, have someone repaint the walls in these rooms with mold-resistant paint. Keep out of the rooms that are being cleaned and painted.  Wash hands frequently. SEEK MEDICAL CARE IF:   You have wheezing, shortness of breath, or a cough even if taking medicine to prevent attacks.  The colored mucus you cough up (sputum) is thicker than usual.  Your sputum changes from clear or white to yellow, green, gray, or bloody.  You have any problems that may be related to the medicines you are taking (such as a rash, itching, swelling, or trouble breathing).  You are using a reliever medicine more than 2-3 times per week.  Your peak flow is still at 50-79% of your personal best after following your action plan for 1 hour.  You have a fever. SEEK IMMEDIATE MEDICAL CARE IF:   You seem to be getting worse and are unresponsive to treatment during an asthma attack.  You are short of breath even at rest.  You get short of breath when doing very little physical activity.  You have difficulty eating, drinking, or talking due to asthma symptoms.  You develop chest pain.  You  develop a fast heartbeat.  You have a bluish color to your lips or fingernails.  You are light-headed, dizzy, or faint.  Your peak flow is less than 50% of your personal best. MAKE SURE YOU:   Understand these instructions.  Will watch your condition.  Will get help right away if you are not doing well or get worse. Document Released: 07/24/2005 Document Revised: 12/08/2013 Document Reviewed: 02/20/2013 [facility] Patient Information 2015 [place], [place]. This information is not intended to replace advice given to you by your health care provider. Make sure you discuss any questions you have with your health care provider.
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
## 2014-10-08 NOTE — ED Notes (Signed)
Pt presents with shortness of breath since beginning of winter and a recent productive cough yellow in color.  Pt reports home inhaler is not helping.  Pt speaking in full sentences without difficulty at present.

## 2014-10-10 ENCOUNTER — Telehealth (HOSPITAL_BASED_OUTPATIENT_CLINIC_OR_DEPARTMENT_OTHER): Payer: Self-pay | Admitting: Emergency Medicine
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
